# Patient Record
Sex: Female | Born: 1990 | Race: White | Hispanic: No | Marital: Married | Smoking: Current every day smoker
Health system: Southern US, Community
[De-identification: ages and names within clinical notes are randomized; demographics above are authoritative.]

## PROBLEM LIST (undated history)

## (undated) ENCOUNTER — Inpatient Hospital Stay: Payer: Self-pay

## (undated) DIAGNOSIS — B1920 Unspecified viral hepatitis C without hepatic coma: Secondary | ICD-10-CM

## (undated) DIAGNOSIS — J45909 Unspecified asthma, uncomplicated: Secondary | ICD-10-CM

## (undated) DIAGNOSIS — F3181 Bipolar II disorder: Secondary | ICD-10-CM

## (undated) DIAGNOSIS — O43119 Circumvallate placenta, unspecified trimester: Secondary | ICD-10-CM

## (undated) DIAGNOSIS — E785 Hyperlipidemia, unspecified: Secondary | ICD-10-CM

## (undated) DIAGNOSIS — K219 Gastro-esophageal reflux disease without esophagitis: Secondary | ICD-10-CM

## (undated) HISTORY — DX: Bipolar II disorder: F31.81

## (undated) HISTORY — DX: Gastro-esophageal reflux disease without esophagitis: K21.9

## (undated) HISTORY — DX: Unspecified asthma, uncomplicated: J45.909

## (undated) HISTORY — DX: Circumvallate placenta, unspecified trimester: O43.119

## (undated) HISTORY — DX: Hyperlipidemia, unspecified: E78.5

## (undated) HISTORY — PX: APPENDECTOMY: SHX54

## (undated) HISTORY — PX: WISDOM TOOTH EXTRACTION: SHX21

---
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
## 2014-06-21 ENCOUNTER — Emergency Department: Payer: Self-pay | Admitting: Emergency Medicine

## 2014-06-21 LAB — COMPREHENSIVE METABOLIC PANEL
ALBUMIN: 4.4 g/dL (ref 3.4–5.0)
ALK PHOS: 83 U/L
Anion Gap: 14 (ref 7–16)
BUN: 10 mg/dL (ref 7–18)
Bilirubin,Total: 0.6 mg/dL (ref 0.2–1.0)
CHLORIDE: 103 mmol/L (ref 98–107)
CO2: 20 mmol/L — AB (ref 21–32)
Calcium, Total: 9.7 mg/dL (ref 8.5–10.1)
Creatinine: 0.87 mg/dL (ref 0.60–1.30)
Glucose: 86 mg/dL (ref 65–99)
OSMOLALITY: 272 (ref 275–301)
Potassium: 3.8 mmol/L (ref 3.5–5.1)
SGOT(AST): 66 U/L — ABNORMAL HIGH (ref 15–37)
SGPT (ALT): 126 U/L — ABNORMAL HIGH
Sodium: 137 mmol/L (ref 136–145)
Total Protein: 8 g/dL (ref 6.4–8.2)

## 2014-06-21 LAB — CBC
HCT: 40.9 % (ref 35.0–47.0)
HGB: 14 g/dL (ref 12.0–16.0)
MCH: 31.7 pg (ref 26.0–34.0)
MCHC: 34.1 g/dL (ref 32.0–36.0)
MCV: 93 fL (ref 80–100)
Platelet: 207 10*3/uL (ref 150–440)
RBC: 4.4 10*6/uL (ref 3.80–5.20)
RDW: 12.5 % (ref 11.5–14.5)
WBC: 7.8 10*3/uL (ref 3.6–11.0)

## 2014-06-21 LAB — URINALYSIS, COMPLETE
BILIRUBIN, UR: NEGATIVE
Blood: NEGATIVE
GLUCOSE, UR: NEGATIVE mg/dL (ref 0–75)
Leukocyte Esterase: NEGATIVE
Nitrite: NEGATIVE
Ph: 5 (ref 4.5–8.0)
Protein: NEGATIVE
SPECIFIC GRAVITY: 1.017 (ref 1.003–1.030)
WBC UR: 5 /HPF (ref 0–5)

## 2014-06-21 LAB — DRUG SCREEN, URINE

## 2014-06-21 LAB — SALICYLATE LEVEL: SALICYLATES, SERUM: 2 mg/dL

## 2014-06-21 LAB — ACETAMINOPHEN LEVEL

## 2014-06-21 LAB — ETHANOL: Ethanol %: <0.003 % (ref 0.000–0.080)

## 2014-06-21 LAB — PREGNANCY, URINE: Pregnancy Test, Urine: NEGATIVE m[IU]/mL

## 2014-07-26 ENCOUNTER — Emergency Department: Payer: Self-pay | Admitting: Emergency Medicine

## 2014-08-02 ENCOUNTER — Emergency Department: Payer: Self-pay | Admitting: Emergency Medicine

## 2014-08-03 ENCOUNTER — Emergency Department: Payer: Self-pay | Admitting: Emergency Medicine

## 2014-08-15 ENCOUNTER — Emergency Department: Payer: Self-pay | Admitting: Emergency Medicine

## 2014-09-16 ENCOUNTER — Emergency Department: Payer: Self-pay | Admitting: Emergency Medicine

## 2014-09-16 LAB — CBC
HCT: 42.8 % (ref 35.0–47.0)
HGB: 14.5 g/dL (ref 12.0–16.0)
MCH: 31.6 pg (ref 26.0–34.0)
MCHC: 33.8 g/dL (ref 32.0–36.0)
MCV: 93 fL (ref 80–100)
Platelet: 173 10*3/uL (ref 150–440)
RBC: 4.58 10*6/uL (ref 3.80–5.20)
RDW: 12.8 % (ref 11.5–14.5)
WBC: 6.6 10*3/uL (ref 3.6–11.0)

## 2014-09-16 LAB — COMPREHENSIVE METABOLIC PANEL
ALBUMIN: 4.5 g/dL (ref 3.4–5.0)
ALK PHOS: 78 U/L
ALT: 199 U/L — AB
Anion Gap: 9 (ref 7–16)
BUN: 7 mg/dL (ref 7–18)
Bilirubin,Total: 0.6 mg/dL (ref 0.2–1.0)
CHLORIDE: 105 mmol/L (ref 98–107)
Calcium, Total: 8.6 mg/dL (ref 8.5–10.1)
Co2: 24 mmol/L (ref 21–32)
Creatinine: 0.74 mg/dL (ref 0.60–1.30)
EGFR (African American): >60
EGFR (Non-African Amer.): >60
GLUCOSE: 85 mg/dL (ref 65–99)
Osmolality: 273 (ref 275–301)
Potassium: 3.7 mmol/L (ref 3.5–5.1)
SGOT(AST): 118 U/L — ABNORMAL HIGH (ref 15–37)
Sodium: 138 mmol/L (ref 136–145)
TOTAL PROTEIN: 8 g/dL (ref 6.4–8.2)

## 2014-09-16 LAB — URINALYSIS, COMPLETE
Bilirubin,UR: NEGATIVE
Blood: NEGATIVE
Glucose,UR: NEGATIVE mg/dL (ref 0–75)
Leukocyte Esterase: NEGATIVE
NITRITE: NEGATIVE
Ph: 6 (ref 4.5–8.0)
Protein: NEGATIVE
RBC, UR: NONE SEEN /HPF (ref 0–5)
Specific Gravity: 1.013 (ref 1.003–1.030)
Squamous Epithelial: 3
WBC UR: 1 /HPF (ref 0–5)

## 2014-10-21 ENCOUNTER — Ambulatory Visit: Payer: Self-pay | Admitting: Family Medicine

## 2014-11-13 NOTE — L&D Delivery Note (Signed)
Delivery Note At 12:47 AM a viable female was delivered via Vaginal, Spontaneous Delivery (Presentation: Left Occiput Anterior).  APGAR: 9, 9; weight  .   Placenta status: Intact, Spontaneous.  Cord: 3 vessels with the following complications: None.  Cord pH: n/a  Anesthesia: Local Epidural yes Episiotomy: None Lacerations: 2nd degree Suture Repair: 3.0 vicryl Est. Blood Loss (mL): 300  Mom to postpartum.  Baby to Couplet care / Skin to Skin.  Mom was complete and labored down for a short time until she could not bear not to push.  Controlled second stage with smooth delivery of head.  No nuchal cord.  Baby delivered and placed on mom's chest.  Delayed cord clamping until pulseless.  Cord doubly clamped and cut by FOB.  Cord blood collected for O+ status.  Placenta spontaneously delivered and intact.  Shallow 2nd degree repaired with 3-0 vicryl.  Mom and baby bonding at time of note; weight not yet recorded.  We sang happy birthday to [place]   ----- Ranae Plumberhelsea Xcaret Morad, MD Attending Obstetrician and Gynecologist [facility]

## 2014-12-26 ENCOUNTER — Emergency Department: Payer: Self-pay | Admitting: Emergency Medicine

## 2015-02-25 ENCOUNTER — Encounter
Admit: 2015-02-25 | Disposition: A | Discharge status: Home / Self Care | Payer: Self-pay | Attending: Obstetrics & Gynecology | Admitting: Obstetrics & Gynecology

## 2015-03-06 NOTE — Consult Note (Signed)
PATIENT NAME:  Shelley Aguirre, Shelley Aguirre MR#:  [id number] DATE OF BIRTH:  09-08-1991  DATE OF CONSULTATION:  06/22/2014  REFERRING PHYSICIAN:   CONSULTING PHYSICIAN:  Audery AmelJohn T. Kenlei Safi, MD  IDENTIFYING INFORMATION AND REASON FOR CONSULT: A 24 year old woman with a history of bipolar disorder, came voluntarily to the Emergency Room because of a tic that she was experiencing.   HISTORY OF PRESENT ILLNESS: Information obtained from the patient and the chart. The patient states that she came to the Emergency Room because she was having a vocal tic that had been going on for some time, consisting of a puffing sort of noise that she made. She had been having some anxiety recently. Her mood had been not particularly depressed. Had not been having any suicidal ideation. Denied any homicidal ideation. Denied any psychotic symptoms. She does have stress related to the fact that she has only recently moved to the [place] area and does not yet have any kind of treatment provider except for her obstetrician. She is trying to get pregnant and is taking Clomid. Otherwise, however, she feels like her psychiatric symptoms are under reasonably good control. She has not been drinking or abusing drugs recently.   PAST PSYCHIATRIC HISTORY: The patient describes a long-standing history of mood problems. She has had past suicide attempts, past cutting, past hospitalizations. Has been on multiple medications. Prior to moving here, she had been living in [place], where evidently she was seeing a psychiatrist and a therapist and felt that she had achieved stability taking a combination of Abilify, lamotrigine and clonazepam. She has now been off of all those medicines since coming to [place]. She has a diagnosis apparently of bipolar disorder.   SUBSTANCE ABUSE HISTORY: The patient states that she drinks only occasionally. Used to have a heroin problem. Has not been using it in quite a while. Denies any other current drug abuse.    SOCIAL HISTORY: She is married. She and her husband live together they just recently moved to this area for her husband's job. Previously, she had been living in [place]. Has no children currently. The patient is not yet working outside the home.   FAMILY HISTORY: Positive.   CURRENT MEDICATIONS: She is taking an aspirin once a day and Clomid on a regular basis from an obstetrician trying to get pregnant.   ALLERGIES: PENICILLIN.   REVIEW OF SYSTEMS: Denies acute depression. Denies suicidal or homicidal ideation. Denies hallucinations. No specific physical symptoms currently, other than being a little bit jittery. She was having a vocal tic again earlier, but that has cleared up.   MENTAL STATUS EXAMINATION: Neatly dressed and groomed woman, looks her stated age, cooperative with the interview. Good eye contact, normal psychomotor activity. Speech normal rate, tone and volume. Affect euthymic, reactive, appropriate. Mood stated as okay. Thoughts are lucid without loosening associations or delusions. Denies auditory or visual hallucinations. Denies suicidal or homicidal ideation. Judgment and insight appear to be good. Short and long-term memory intact to gross testing. Alert and oriented x 4.   LABORATORY RESULTS: Salicylates and acetaminophen negative. Alcohol level negative. Drug screen negative. She does have an elevated ALT at 126 and an elevated AST at 66, and are really the only remarkable laboratories. CBC is normal. Pregnancy test negative.   VITAL SIGNS: Most recent blood pressure is 109/66, respirations 18, pulse 70, temperature 97.9.   ASSESSMENT: A 24 year old woman, who probably has bipolar disorder, type II, versus borderline personality disorder, who is currently relatively asymptomatic. She is not psychotic  and denies any suicidal or homicidal ideation. Has not shown any behavioral problems since being here in the Emergency Room. I am not entirely clear on why she was committed.  The commitment paperwork says that she had mentioned something about slitting her throat, but she denies it, and I do not see it in the other paperwork. At this point, she no longer meets commitment criteria.   TREATMENT PLAN: She will be given a referral to outpatient treatment in the community and encouraged to follow up with them. She was educated about her liver enzymes and encouraged to mention it to a primary care doctor at her earliest opportunity.   DIAGNOSIS, PRINCIPAL AND PRIMARY:  AXIS I: Bipolar disorder, not otherwise specified.   SECONDARY DIAGNOSES:  AXIS I: Opiate dependence, in remission.  AXIS II: Deferred.  AXIS III: No diagnosis.  AXIS IV: Severe from recent relocation.  AXIS V: Functioning at time of evaluation, 60.   ____________________________ Audery Amel, MD jtc:jr D: 06/22/2014 16:30:12 ET T: 06/22/2014 16:39:12 ET JOB#: [id number]  cc: Audery Amel, MD, <Dictator> Audery Amel MD ELECTRONICALLY SIGNED 07/24/2014 16:54
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
## 2015-04-16 ENCOUNTER — Encounter: Payer: Self-pay | Admitting: Family Medicine

## 2015-04-16 ENCOUNTER — Ambulatory Visit (INDEPENDENT_AMBULATORY_CARE_PROVIDER_SITE_OTHER): Payer: Commercial Managed Care - HMO | Admitting: Family Medicine

## 2015-04-16 VITALS — BP 116/73 | HR 109 | Temp 97.8°F | Ht 65.3 in | Wt 207.4 lb

## 2015-04-16 DIAGNOSIS — M9905 Segmental and somatic dysfunction of pelvic region: Secondary | ICD-10-CM | POA: Diagnosis not present

## 2015-04-16 DIAGNOSIS — M9903 Segmental and somatic dysfunction of lumbar region: Secondary | ICD-10-CM | POA: Diagnosis not present

## 2015-04-16 DIAGNOSIS — M9909 Segmental and somatic dysfunction of abdomen and other regions: Secondary | ICD-10-CM

## 2015-04-16 DIAGNOSIS — M5441 Lumbago with sciatica, right side: Secondary | ICD-10-CM

## 2015-04-16 DIAGNOSIS — M9904 Segmental and somatic dysfunction of sacral region: Secondary | ICD-10-CM | POA: Diagnosis not present

## 2015-04-16 DIAGNOSIS — F3181 Bipolar II disorder: Secondary | ICD-10-CM | POA: Diagnosis not present

## 2015-04-16 DIAGNOSIS — M9906 Segmental and somatic dysfunction of lower extremity: Secondary | ICD-10-CM

## 2015-04-16 DIAGNOSIS — M545 Low back pain, unspecified: Secondary | ICD-10-CM | POA: Insufficient documentation

## 2015-04-16 DIAGNOSIS — Z3490 Encounter for supervision of normal pregnancy, unspecified, unspecified trimester: Secondary | ICD-10-CM

## 2015-04-16 NOTE — Assessment & Plan Note (Signed)
Likely will be exacerbating her current symptoms due to the relaxin. Will see her during her pregnancy following her OB schedule.

## 2015-04-16 NOTE — Patient Instructions (Signed)
Back Injury Prevention The following tips can help you to prevent a back injury. PHYSICAL FITNESS  Exercise often. Try to develop strong stomach (abdominal) muscles.  Do aerobic exercises often. This includes walking, jogging, biking, swimming.  Do exercises that help with balance and strength often. This includes tai chi and yoga.  Stretch before and after you exercise.  Keep a healthy weight. DIET   Ask your doctor how much calcium and vitamin D you need every day.  Include calcium in your diet. Foods high in calcium include dairy products; green, leafy vegetables; and products with calcium added (fortified).  Include vitamin D in your diet. Foods high in vitamin D include milk and products with vitamin D added.  Think about taking a multivitamin or other nutritional products called " supplements."  Stop smoking if you smoke. POSTURE   Sit and stand up straight. Avoid leaning forward or hunching over.  Choose chairs that support your lower back.  If you work at a desk:  Sit close to your work so you do not lean over.  Keep your chin tucked in.  Keep your neck drawn back.  Keep your elbows bent at a right angle. Your arms should look like the letter "L."  Sit high and close to the steering wheel when you drive. Add low back support to your car seat if needed.  Avoid sitting or standing in one position for too long. Get up and move around every hour. Take breaks if you are driving for a long time.  Sleep on your side with your knees slightly bent. You can also sleep on your back with a pillow under your knees. Do not sleep on your stomach. LIFTING, TWISTING, AND REACHING  Avoid heavy lifting, especially lifting over and over again. If you must do heavy lifting:  Stretch before lifting.  Work slowly.  Rest between lifts.  Use carts and dollies to move objects when possible.  Make several small trips instead of carrying 1 heavy load.  Ask for help when you  need it.  Ask for help when moving big, awkward objects.  Follow these steps when lifting:  Stand with your feet shoulder-width apart.  Get as close to the object as you can. Do not pick up heavy objects that are far from your body.  Use handles or lifting straps when possible.  Bend at your knees. Squat down, but keep your heels off the floor.  Keep your shoulders back, your chin tucked in, and your back straight.  Lift the object slowly. Tighten the muscles in your legs, stomach, and butt. Keep the object as close to the center of your body as possible.  Reverse these directions when you put a load down.  Do not:  Lift the object above your waist.  Twist at the waist while lifting or carrying a load. Move your feet if you need to turn, not your waist.  Bend over without bending at your knees.  Avoid reaching over your head, across a table, or for an object on a high surface. OTHER TIPS  Avoid wet floors and keep sidewalks clear of ice.  Do not sleep on a mattress that is too soft or too hard.  Keep items that you use often within easy reach.  Put heavier objects on shelves at waist level. Put lighter objects on lower or higher shelves.  Find ways to lessen your stress. You can try exercise, massage, or relaxation.  Get help for depression or anxiety if  needed. GET HELP IF:  You injure your back.  You have questions about diet, exercise, or other ways to prevent back injuries. MAKE SURE YOU:  Understand these instructions.  Will watch your condition.  Will get help right away if you are not doing well or get worse. Document Released: 04/17/2008 Document Revised: 01/22/2012 Document Reviewed: 12/11/2011 [facility] Patient Information 2015 [place], [place]. This information is not intended to replace advice given to you by your health care provider. Make sure you discuss any questions you have with your health care provider.

## 2015-04-16 NOTE — Assessment & Plan Note (Signed)
Advised patient that she should try to see psychiatry. Name of Dr. Maryruth [place]Kapur in [place] given to patient. Continue to monitor closely both here and with her OB.

## 2015-04-16 NOTE — Assessment & Plan Note (Signed)
This appears to be better today as she has been doing her stretches and has not been working anymore. 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 I do think she would benefit from some PT as well prior to her not being able to do due to her pregnancy. Referral generated 2 visits ago, but she hasn't gone yet. She does appear to have some somatic dysfunction that is contributing to her symptoms and I think she would benefit from OMT. Patient treated today as discussed below.
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
## 2015-04-22 ENCOUNTER — Telehealth: Payer: Self-pay

## 2015-04-22 NOTE — Telephone Encounter (Signed)
Patient would like to know if you have found a Psychiatrist for her that will take her insurance or work with her on payment.

## 2015-04-22 NOTE — Telephone Encounter (Signed)
Called patient, let her know to try and call Dr.Kapur [phone] and [facility] [phone]. I also mailed out a copy of our list of psychiatrist.

## 2015-04-30 ENCOUNTER — Encounter: Payer: Self-pay | Admitting: Family Medicine

## 2015-04-30 ENCOUNTER — Ambulatory Visit (INDEPENDENT_AMBULATORY_CARE_PROVIDER_SITE_OTHER): Payer: Commercial Managed Care - HMO | Admitting: Family Medicine

## 2015-04-30 VITALS — BP 126/86 | HR 82 | Temp 97.8°F | Resp 16 | Ht 65.5 in | Wt 212.8 lb

## 2015-04-30 DIAGNOSIS — M9903 Segmental and somatic dysfunction of lumbar region: Secondary | ICD-10-CM | POA: Diagnosis not present

## 2015-04-30 DIAGNOSIS — M9905 Segmental and somatic dysfunction of pelvic region: Secondary | ICD-10-CM

## 2015-04-30 DIAGNOSIS — M5441 Lumbago with sciatica, right side: Secondary | ICD-10-CM

## 2015-04-30 DIAGNOSIS — Z3490 Encounter for supervision of normal pregnancy, unspecified, unspecified trimester: Secondary | ICD-10-CM

## 2015-04-30 DIAGNOSIS — M999 Biomechanical lesion, unspecified: Secondary | ICD-10-CM

## 2015-04-30 DIAGNOSIS — M9904 Segmental and somatic dysfunction of sacral region: Secondary | ICD-10-CM | POA: Diagnosis not present

## 2015-04-30 DIAGNOSIS — M9909 Segmental and somatic dysfunction of abdomen and other regions: Secondary | ICD-10-CM | POA: Diagnosis not present

## 2015-04-30 DIAGNOSIS — M9902 Segmental and somatic dysfunction of thoracic region: Secondary | ICD-10-CM | POA: Diagnosis not present

## 2015-04-30 DIAGNOSIS — Z331 Pregnant state, incidental: Secondary | ICD-10-CM | POA: Diagnosis not present

## 2015-04-30 NOTE — Assessment & Plan Note (Signed)
Likely will be exacerbating her current symptoms due to the relaxin. Her abdominal pain sounds like round ligament pain, but she will discuss it with her OB.  Will see her during her pregnancy following her OB schedule.

## 2015-04-30 NOTE — Patient Instructions (Signed)
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
## 2015-05-14 ENCOUNTER — Ambulatory Visit (INDEPENDENT_AMBULATORY_CARE_PROVIDER_SITE_OTHER): Payer: Commercial Managed Care - HMO | Admitting: Family Medicine

## 2015-05-14 ENCOUNTER — Encounter: Payer: Self-pay | Admitting: Family Medicine

## 2015-05-14 VITALS — BP 123/83 | HR 82 | Temp 97.8°F | Wt 214.8 lb

## 2015-05-14 DIAGNOSIS — Z331 Pregnant state, incidental: Secondary | ICD-10-CM

## 2015-05-14 DIAGNOSIS — M5441 Lumbago with sciatica, right side: Secondary | ICD-10-CM | POA: Diagnosis not present

## 2015-05-14 DIAGNOSIS — Z3490 Encounter for supervision of normal pregnancy, unspecified, unspecified trimester: Secondary | ICD-10-CM

## 2015-05-14 DIAGNOSIS — M9902 Segmental and somatic dysfunction of thoracic region: Secondary | ICD-10-CM | POA: Diagnosis not present

## 2015-05-14 DIAGNOSIS — M9903 Segmental and somatic dysfunction of lumbar region: Secondary | ICD-10-CM | POA: Diagnosis not present

## 2015-05-14 DIAGNOSIS — M9909 Segmental and somatic dysfunction of abdomen and other regions: Secondary | ICD-10-CM | POA: Diagnosis not present

## 2015-05-14 DIAGNOSIS — M9906 Segmental and somatic dysfunction of lower extremity: Secondary | ICD-10-CM | POA: Diagnosis not present

## 2015-05-14 DIAGNOSIS — M9904 Segmental and somatic dysfunction of sacral region: Secondary | ICD-10-CM | POA: Diagnosis not present

## 2015-05-14 DIAGNOSIS — M9905 Segmental and somatic dysfunction of pelvic region: Secondary | ICD-10-CM | POA: Diagnosis not present

## 2015-05-14 DIAGNOSIS — M999 Biomechanical lesion, unspecified: Secondary | ICD-10-CM

## 2015-05-14 NOTE — Assessment & Plan Note (Signed)
Stable. Contributing to her symptoms. Continue to follow with OBGYN
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
## 2015-05-14 NOTE — Patient Instructions (Signed)

## 2015-05-28 ENCOUNTER — Ambulatory Visit (INDEPENDENT_AMBULATORY_CARE_PROVIDER_SITE_OTHER): Payer: Commercial Managed Care - HMO | Admitting: Family Medicine

## 2015-05-28 ENCOUNTER — Encounter: Payer: Self-pay | Admitting: Family Medicine

## 2015-05-28 VITALS — BP 119/77 | HR 86 | Temp 98.0°F | Wt 217.4 lb

## 2015-05-28 DIAGNOSIS — M9903 Segmental and somatic dysfunction of lumbar region: Secondary | ICD-10-CM

## 2015-05-28 DIAGNOSIS — Z3490 Encounter for supervision of normal pregnancy, unspecified, unspecified trimester: Secondary | ICD-10-CM

## 2015-05-28 DIAGNOSIS — M9904 Segmental and somatic dysfunction of sacral region: Secondary | ICD-10-CM

## 2015-05-28 DIAGNOSIS — M999 Biomechanical lesion, unspecified: Secondary | ICD-10-CM

## 2015-05-28 DIAGNOSIS — M9905 Segmental and somatic dysfunction of pelvic region: Secondary | ICD-10-CM | POA: Diagnosis not present

## 2015-05-28 DIAGNOSIS — M5441 Lumbago with sciatica, right side: Secondary | ICD-10-CM

## 2015-05-28 DIAGNOSIS — M9909 Segmental and somatic dysfunction of abdomen and other regions: Secondary | ICD-10-CM | POA: Diagnosis not present

## 2015-05-28 DIAGNOSIS — Z331 Pregnant state, incidental: Secondary | ICD-10-CM | POA: Diagnosis not present

## 2015-05-28 DIAGNOSIS — M9908 Segmental and somatic dysfunction of rib cage: Secondary | ICD-10-CM

## 2015-05-28 NOTE — Assessment & Plan Note (Signed)
Progressing well. 27 weeks. Continue to follow with GYN. Continue to monitor.
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
## 2015-06-11 ENCOUNTER — Encounter: Payer: Self-pay | Admitting: Family Medicine

## 2015-06-11 ENCOUNTER — Ambulatory Visit (INDEPENDENT_AMBULATORY_CARE_PROVIDER_SITE_OTHER): Payer: Commercial Managed Care - HMO | Admitting: Family Medicine

## 2015-06-11 VITALS — BP 139/81 | HR 86 | Temp 98.3°F | Wt 218.0 lb

## 2015-06-11 DIAGNOSIS — M5441 Lumbago with sciatica, right side: Secondary | ICD-10-CM | POA: Diagnosis not present

## 2015-06-11 DIAGNOSIS — M9905 Segmental and somatic dysfunction of pelvic region: Secondary | ICD-10-CM

## 2015-06-11 DIAGNOSIS — Z331 Pregnant state, incidental: Secondary | ICD-10-CM

## 2015-06-11 DIAGNOSIS — M9903 Segmental and somatic dysfunction of lumbar region: Secondary | ICD-10-CM

## 2015-06-11 DIAGNOSIS — M9904 Segmental and somatic dysfunction of sacral region: Secondary | ICD-10-CM | POA: Diagnosis not present

## 2015-06-11 DIAGNOSIS — M9902 Segmental and somatic dysfunction of thoracic region: Secondary | ICD-10-CM

## 2015-06-11 DIAGNOSIS — Z3490 Encounter for supervision of normal pregnancy, unspecified, unspecified trimester: Secondary | ICD-10-CM

## 2015-06-11 DIAGNOSIS — M999 Biomechanical lesion, unspecified: Secondary | ICD-10-CM
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
## 2015-06-12 NOTE — Assessment & Plan Note (Signed)
This seems to be in mild exacerbation today due to fetal lie, but still relatively stable. 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 On limited activity due to placental location, She does appear to have some somatic dysfunction that is contributing to her symptoms and I think she would benefit from OMT. Patient treated today as discussed below.

## 2015-06-12 NOTE — Assessment & Plan Note (Signed)
Progressing well. 29 weeks. Continue to follow with GYN. Continue to monitor.

## 2015-06-25 ENCOUNTER — Encounter: Payer: Self-pay | Admitting: Family Medicine

## 2015-06-25 ENCOUNTER — Ambulatory Visit (INDEPENDENT_AMBULATORY_CARE_PROVIDER_SITE_OTHER): Payer: Commercial Managed Care - HMO | Admitting: Family Medicine

## 2015-06-25 VITALS — BP 111/77 | HR 86 | Temp 98.6°F | Wt 221.4 lb

## 2015-06-25 DIAGNOSIS — M9905 Segmental and somatic dysfunction of pelvic region: Secondary | ICD-10-CM | POA: Diagnosis not present

## 2015-06-25 DIAGNOSIS — M9904 Segmental and somatic dysfunction of sacral region: Secondary | ICD-10-CM | POA: Diagnosis not present

## 2015-06-25 DIAGNOSIS — M9900 Segmental and somatic dysfunction of head region: Secondary | ICD-10-CM | POA: Diagnosis not present

## 2015-06-25 DIAGNOSIS — M9903 Segmental and somatic dysfunction of lumbar region: Secondary | ICD-10-CM

## 2015-06-25 DIAGNOSIS — M9902 Segmental and somatic dysfunction of thoracic region: Secondary | ICD-10-CM

## 2015-06-25 DIAGNOSIS — Z3490 Encounter for supervision of normal pregnancy, unspecified, unspecified trimester: Secondary | ICD-10-CM

## 2015-06-25 DIAGNOSIS — Z331 Pregnant state, incidental: Secondary | ICD-10-CM

## 2015-06-25 DIAGNOSIS — M9909 Segmental and somatic dysfunction of abdomen and other regions: Secondary | ICD-10-CM

## 2015-06-25 DIAGNOSIS — M5441 Lumbago with sciatica, right side: Secondary | ICD-10-CM

## 2015-06-25 NOTE — Progress Notes (Signed)
BP 111/77 mmHg  Pulse 86  Temp(Src) 98.6 F (37 C)  Wt 221 lb 6.4 oz (100.426 kg)  SpO2 98%  LMP  (Within Months)   Subjective:    Patient ID: Shelley Aguirre, female    DOB: [birth year], 24 y.o.   MRN: [id number]  HPI: Shelley Aguirre is a 23 y.o. female  Chief Complaint  Patient presents with  . Back Pain    OMM   Shelley Aguirre is feeling well today. She is at 31 weeks and progressing well. Baby still breach. Continuing with pain in her R hip and low back, but dealing with it better. Lots of pressure in her belly from baby kicking and pushing. No numbness or tingling. No shooting pains. Bending over and working makes it worse. Otherwise has been doing well. OMT helps. She would like to continue.   Relevant past medical, surgical, family and social history reviewed and updated as indicated. Interim medical history since our last visit reviewed. Allergies and medications reviewed and updated.  Review of Systems  Constitutional: Negative.   Respiratory: Negative.   Cardiovascular: Negative.   Gastrointestinal: Negative.   Musculoskeletal: Negative.   Psychiatric/Behavioral: Negative.    Per HPI unless specifically indicated above    Objective:    BP 111/77 mmHg  Pulse 86  Temp(Src) 98.6 F (37 C)  Wt 221 lb 6.4 oz (100.426 kg)  SpO2 98%  LMP  (Within Months)  Wt Readings from Last 3 Encounters:  06/25/15 221 lb 6.4 oz (100.426 kg)  06/11/15 218 lb (98.884 kg)  05/28/15 217 lb 6.4 oz (98.612 kg)    Physical Exam  Constitutional: She is oriented to person, place, and time. She appears well-developed and well-nourished. No distress.  HENT:  Head: Normocephalic and atraumatic.  Right Ear: Hearing normal.  Left Ear: Hearing normal.  Nose: Nose normal.  Eyes: Conjunctivae and lids are normal. Right eye exhibits no discharge. Left eye exhibits no discharge. No scleral icterus.  Pulmonary/Chest: Effort normal. No respiratory distress.  Abdominal: Soft. She exhibits no distension and no  mass. There is no tenderness. There is no rebound and no guarding.  Gravid  Neurological: She is alert and oriented to person, place, and time.  Skin: Skin is intact. No rash noted.  Psychiatric: She has a normal mood and affect. Her speech is normal and behavior is normal. Judgment and thought content normal. Cognition and memory are normal.  Nursing note and vitals reviewed. Musculoskeletal:  Exam found Decreased ROM, Tissue texture changes and Tenderness to palpation of patient's  head, thorax, lumbar, pelvis, sacrum and abdomen Osteopathic Structural Exam:   Head: hypertonic subocciptial muscles  Thorax: T3-5SLRR, T6-9SRRL  Lumbar: QL hypertonic on the R  Pelvis: Anterior R innominate  Sacrum: R on R torsion  Abdomen: pelvic diaphragm strain from uterus and fetus  Results for orders placed or performed in visit on 09/16/14  Urinalysis, Complete  Result Value Ref Range   Color - urine Yellow    Clarity - urine Hazy    Glucose,UR Negative 0-75 mg/dL   Bilirubin,UR Negative NEGATIVE   Ketone 1+ NEGATIVE   Specific Gravity 1.013 1.003-1.030   Blood Negative NEGATIVE   Ph 6.0 4.5-8.0   Protein Negative NEGATIVE   Nitrite Negative NEGATIVE   Leukocyte Esterase Negative NEGATIVE   RBC,UR NONE SEEN 0-5 /HPF   WBC UR 1 /HPF 0-5 /HPF   Bacteria TRACE NONE SEEN   Squamous Epithelial 3 /HPF    Mucous PRESENT   Comprehensive metabolic  panel  Result Value Ref Range   Glucose 85 65-99 mg/dL   BUN 7 7-18 mg/dL   Creatinine 0.74 0.60-1.30 mg/dL   Sodium 138 136-145 mmol/L   Potassium 3.7 3.5-5.1 mmol/L   Chloride 105 98-107 mmol/L   Co2 24 21-32 mmol/L   Calcium, Total 8.6 8.5-10.1 mg/dL   SGOT(AST) 118 (H) 15-37 Unit/L   SGPT (ALT) 199 (H) U/L   Alkaline Phosphatase 78 Unit/L   Albumin 4.5 3.4-5.0 g/dL   Total Protein 8.0 6.4-8.2 g/dL   Bilirubin,Total 0.6 0.2-1.0 mg/dL   Osmolality 273 275-301   Anion Gap 9 7-16   EGFR (African American) >60 >65m/min   EGFR (Non-African  Amer.) >60 >645mmin  CBC  Result Value Ref Range   WBC 6.6 3.6-11.0 x10 3/mm 3   RBC 4.58 3.80-5.20 X10 6/mm 3   HGB 14.5 12.0-16.0 g/dL   HCT 42.8 35.0-47.0 %   MCV 93 80-100 fL   MCH 31.6 26.0-34.0 pg   MCHC 33.8 32.0-36.0 g/dL   RDW 12.8 11.5-14.5 %   Platelet 173 150-440 x10 3/mm 3      Assessment & Plan:   Problem List Items Addressed This Visit      Other   Low back pain - Primary    This seems to be stable today. 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 On limited activity due to placental location, She does appear to have some somatic dysfunction that is contributing to her symptoms and I think she would benefit from OMT. Patient treated today as discussed below.               Pregnancy    Progressing well. 31 weeks. Continue to follow with GYN. Continue to monitor.            Other Visit Diagnoses    Somatic dysfunction of spine, sacral        Somatic dysfunction of abdominal region        Segmental dysfunction of pelvic region        Somatic dysfunction of spine, lumbar        Thoracic segment dysfunction        Somatic dysfunction of head region         After verbal consent was obtained, patient was treated today with osteopathic manipulative medicine to the regions of the head, thorax, lumbar, pelvis, sacrum and abdomen using the techniques of Still, myofascial release, counterstrain, muscle energy, HVLA and soft tissue. Areas of compensation relating to her primary pain source also treated. Patient tolerated the procedure well with good objective and good subjective improvement in symptoms. She left the room in good condition. She was advised to stay well hydrated and that she may have some soreness following the procedure. If not improving or worsening, she will call and come in. She will return for reevaluation   in 2-3  weeks.   Follow up plan: Return in about 2 weeks (around 07/09/2015).

## 2015-06-25 NOTE — Assessment & Plan Note (Signed)
Progressing well. 31 weeks. Continue to follow with GYN. Continue to monitor.

## 2015-06-25 NOTE — Assessment & Plan Note (Signed)
This seems to be stable today. 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 On limited activity due to placental location, She does appear to have some somatic dysfunction that is contributing to her symptoms and I think she would benefit from OMT. Patient treated today as discussed below.

## 2015-07-09 ENCOUNTER — Encounter: Payer: Self-pay | Admitting: Family Medicine

## 2015-07-09 ENCOUNTER — Ambulatory Visit (INDEPENDENT_AMBULATORY_CARE_PROVIDER_SITE_OTHER): Payer: Commercial Managed Care - HMO | Admitting: Family Medicine

## 2015-07-09 VITALS — BP 123/73 | HR 105 | Temp 98.2°F | Wt 220.6 lb

## 2015-07-09 DIAGNOSIS — M9903 Segmental and somatic dysfunction of lumbar region: Secondary | ICD-10-CM | POA: Diagnosis not present

## 2015-07-09 DIAGNOSIS — M9902 Segmental and somatic dysfunction of thoracic region: Secondary | ICD-10-CM

## 2015-07-09 DIAGNOSIS — Z3490 Encounter for supervision of normal pregnancy, unspecified, unspecified trimester: Secondary | ICD-10-CM

## 2015-07-09 DIAGNOSIS — Z331 Pregnant state, incidental: Secondary | ICD-10-CM

## 2015-07-09 DIAGNOSIS — M9908 Segmental and somatic dysfunction of rib cage: Secondary | ICD-10-CM | POA: Diagnosis not present

## 2015-07-09 DIAGNOSIS — M9905 Segmental and somatic dysfunction of pelvic region: Secondary | ICD-10-CM | POA: Diagnosis not present

## 2015-07-09 DIAGNOSIS — M5441 Lumbago with sciatica, right side: Secondary | ICD-10-CM | POA: Diagnosis not present

## 2015-07-09 DIAGNOSIS — M999 Biomechanical lesion, unspecified: Secondary | ICD-10-CM

## 2015-07-09 DIAGNOSIS — M9904 Segmental and somatic dysfunction of sacral region: Secondary | ICD-10-CM | POA: Diagnosis not present

## 2015-07-09 DIAGNOSIS — M9900 Segmental and somatic dysfunction of head region: Secondary | ICD-10-CM

## 2015-07-09 NOTE — Assessment & Plan Note (Signed)
Progressing well. 33 weeks. Continue to follow with GYN. Continue to monitor.

## 2015-07-09 NOTE — Assessment & Plan Note (Addendum)
This seems to be stable today, baby appears to have moved if not flipped, putting less pressure on the R side. 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 On limited activity due to placental location, She does appear to have some somatic dysfunction that is contributing to her symptoms and I think she would benefit from OMT. Patient treated today as discussed below.

## 2015-07-09 NOTE — Progress Notes (Signed)
BP 123/73 mmHg  Pulse 105  Temp(Src) 98.2 F (36.8 C)  Wt 220 lb 9.6 oz (100.064 kg)  SpO2 99%  LMP  (Within Months)   Subjective:    Patient ID: Shelley Aguirre, female    DOB: August 29, 1991, 24 y.o.   MRN: [id number]  HPI: Shelley Aguirre is a 24 y.o. female who presents today for evaluation of low back pain and possible treatment with OMT.   Chief Complaint  Patient presents with  . Back Pain   Shelley Aguirre is doing OK today. She is currently [redacted] weeks pregnant and progressing well. She is following with her OBGYN and they are happy with how she is doing. She thinks that he flipped from her last visit and is no longer breach. She finds out on Monday at her appointment. She notes that she is having sharp pains in her pelvis the past couple of days, they feel electric.  She notes that her back is acting up a bit more because of the pressure as the baby gets bigger. She is trying to stay off her feet and rest as when she moves around more, her pain increases. She continues to have the pin in her low back and R hip that will radiate into her R thigh. She is tolerating it well. She denies any numbness or tingling, denies andy shooting pains. She still finds that bending over and working makes it worse. OMT has been helping and she finds that she feels better for several days following her treatments. She would like to continue with them. No other concerns or complaints at this time.   Relevant past medical, surgical, family and social history reviewed and updated as indicated. Interim medical history since our last visit reviewed. Allergies and medications reviewed and updated.  Review of Systems  Constitutional: Negative.   Respiratory: Negative.   Cardiovascular: Negative.   Musculoskeletal: Positive for myalgias, back pain and gait problem. Negative for joint swelling, arthralgias, neck pain and neck stiffness.  Psychiatric/Behavioral: Negative.     Per HPI unless specifically indicated above      Objective:    BP 123/73 mmHg  Pulse 105  Temp(Src) 98.2 F (36.8 C)  Wt 220 lb 9.6 oz (100.064 kg)  SpO2 99%  LMP  (Within Months)  Wt Readings from Last 3 Encounters:  07/09/15 220 lb 9.6 oz (100.064 kg)  06/25/15 221 lb 6.4 oz (100.426 kg)  06/11/15 218 lb (98.884 kg)    Physical Exam  Constitutional: She is oriented to person, place, and time. She appears well-developed and well-nourished. No distress.  HENT:  Head: Normocephalic and atraumatic.  Right Ear: Hearing normal.  Left Ear: Hearing normal.  Nose: Nose normal.  Eyes: Conjunctivae and lids are normal. Right eye exhibits no discharge. Left eye exhibits no discharge. No scleral icterus.  Pulmonary/Chest: Effort normal. No respiratory distress.  Abdominal: Soft. She exhibits no distension and no mass. There is no tenderness. There is no rebound and no guarding.  Gravid  Musculoskeletal: Normal range of motion.  Neurological: She is alert and oriented to person, place, and time.  Skin: Skin is warm, dry and intact. No rash noted. No erythema. No pallor.  Psychiatric: She has a normal mood and affect. Her speech is normal and behavior is normal. Judgment and thought content normal. Cognition and memory are normal.  Musculoskeletal:  Exam found Decreased ROM, Tissue texture changes and Tenderness to palpation of patient's  head, thorax, ribs, lumbar, pelvis and sacrum Osteopathic Structural Exam:  Head: OAESSR, hypertonic suboccipital muscles, L>R  Thorax: T2-5SRRL, T6ESRR  Ribs: Ribs 8-10 locked up on the R  Lumbar: QL and psoas hypertonic on the R  Pelvis: pelvis compressed, R SI joint restricted, Anterior R innominate  Sacrum: L on L torsion, SI joint restricted on the R    Results for orders placed or performed in visit on 09/16/14  Urinalysis, Complete  Result Value Ref Range   Color - urine Yellow    Clarity - urine Hazy    Glucose,UR Negative 0-75 mg/dL   Bilirubin,UR Negative NEGATIVE   Ketone 1+  NEGATIVE   Specific Gravity 1.013 1.003-1.030   Blood Negative NEGATIVE   Ph 6.0 4.5-8.0   Protein Negative NEGATIVE   Nitrite Negative NEGATIVE   Leukocyte Esterase Negative NEGATIVE   RBC,UR NONE SEEN 0-5 /HPF   WBC UR 1 /HPF 0-5 /HPF   Bacteria TRACE NONE SEEN   Squamous Epithelial 3 /HPF    Mucous PRESENT   Comprehensive metabolic panel  Result Value Ref Range   Glucose 85 65-99 mg/dL   BUN 7 7-18 mg/dL   Creatinine 0.74 0.60-1.30 mg/dL   Sodium 138 136-145 mmol/L   Potassium 3.7 3.5-5.1 mmol/L   Chloride 105 98-107 mmol/L   Co2 24 21-32 mmol/L   Calcium, Total 8.6 8.5-10.1 mg/dL   SGOT(AST) 118 (H) 15-37 Unit/L   SGPT (ALT) 199 (H) U/L   Alkaline Phosphatase 78 Unit/L   Albumin 4.5 3.4-5.0 g/dL   Total Protein 8.0 6.4-8.2 g/dL   Bilirubin,Total 0.6 0.2-1.0 mg/dL   Osmolality 273 275-301   Anion Gap 9 7-16   EGFR (African American) >60 >4m/min   EGFR (Non-African Amer.) >60 >631mmin  CBC  Result Value Ref Range   WBC 6.6 3.6-11.0 x10 3/mm 3   RBC 4.58 3.80-5.20 X10 6/mm 3   HGB 14.5 12.0-16.0 g/dL   HCT 42.8 35.0-47.0 %   MCV 93 80-100 fL   MCH 31.6 26.0-34.0 pg   MCHC 33.8 32.0-36.0 g/dL   RDW 12.8 11.5-14.5 %   Platelet 173 150-440 x10 3/mm 3      Assessment & Plan:   Problem List Items Addressed This Visit      Other   Low back pain - Primary    This seems to be stable today, baby appears to have moved if not flipped, putting less pressure on the R side. 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 On limited activity due to placental location, She does appear to have some somatic dysfunction that is contributing to her symptoms and I think she would benefit from OMT. Patient treated today as discussed below.                 Pregnancy    Progressing well. 33 weeks. Continue to follow with GYN. Continue to monitor.               Other Visit Diagnoses    Somatic dysfunction of spine, sacral        Segmental dysfunction of pelvic region        Somatic dysfunction of spine, lumbar        Thoracic segment dysfunction        Somatic dysfunction of head region        Nonallopathic lesion of rib cage          After verbal  consent was obtained, patient was treated today with osteopathic manipulative medicine to the regions of the head, thorax, ribs, lumbar, pelvis and sacrum using the techniques of Still, FPR, myofascial release, counterstrain, muscle energy, HVLA and soft tissue. Areas of compensation relating to her primary pain source also treated. Patient tolerated the procedure well with good objective and good subjective improvement in symptoms. She left the room in good condition. She was advised to stay well hydrated and that she may have some soreness following the procedure. If not improving or worsening, she will call and come in. She will return for reevaluation   in 2-3 weeks.   Follow up plan: Return in about 2 weeks (around 07/23/2015).

## 2015-07-24 ENCOUNTER — Observation Stay: Payer: Commercial Managed Care - HMO

## 2015-07-24 ENCOUNTER — Observation Stay
Admission: EM | Admit: 2015-07-24 | Discharge: 2015-07-24 | Disposition: A | Discharge status: Home / Self Care | Payer: Commercial Managed Care - HMO | Attending: Obstetrics and Gynecology | Admitting: Obstetrics and Gynecology

## 2015-07-24 ENCOUNTER — Encounter: Payer: Self-pay | Admitting: *Deleted

## 2015-07-24 DIAGNOSIS — Z3A35 35 weeks gestation of pregnancy: Secondary | ICD-10-CM | POA: Diagnosis not present

## 2015-07-24 DIAGNOSIS — J45909 Unspecified asthma, uncomplicated: Secondary | ICD-10-CM | POA: Insufficient documentation

## 2015-07-24 DIAGNOSIS — E785 Hyperlipidemia, unspecified: Secondary | ICD-10-CM | POA: Insufficient documentation

## 2015-07-24 DIAGNOSIS — O99343 Other mental disorders complicating pregnancy, third trimester: Secondary | ICD-10-CM | POA: Diagnosis not present

## 2015-07-24 DIAGNOSIS — K219 Gastro-esophageal reflux disease without esophagitis: Secondary | ICD-10-CM | POA: Insufficient documentation

## 2015-07-24 DIAGNOSIS — O26893 Other specified pregnancy related conditions, third trimester: Principal | ICD-10-CM | POA: Insufficient documentation

## 2015-07-24 DIAGNOSIS — Z87891 Personal history of nicotine dependence: Secondary | ICD-10-CM | POA: Diagnosis not present

## 2015-07-24 DIAGNOSIS — F3181 Bipolar II disorder: Secondary | ICD-10-CM | POA: Insufficient documentation

## 2015-07-24 DIAGNOSIS — R112 Nausea with vomiting, unspecified: Secondary | ICD-10-CM | POA: Insufficient documentation

## 2015-07-24 DIAGNOSIS — M549 Dorsalgia, unspecified: Secondary | ICD-10-CM | POA: Insufficient documentation

## 2015-07-24 DIAGNOSIS — B1920 Unspecified viral hepatitis C without hepatic coma: Secondary | ICD-10-CM | POA: Diagnosis not present

## 2015-07-24 DIAGNOSIS — R1011 Right upper quadrant pain: Secondary | ICD-10-CM | POA: Diagnosis present

## 2015-07-24 DIAGNOSIS — M546 Pain in thoracic spine: Secondary | ICD-10-CM | POA: Diagnosis present

## 2015-07-24 HISTORY — DX: Unspecified viral hepatitis C without hepatic coma: B19.20

## 2015-07-24 LAB — COMPREHENSIVE METABOLIC PANEL
ALT: 41 U/L (ref 14–54)
ANION GAP: 10 (ref 5–15)
AST: 33 U/L (ref 15–41)
Albumin: 3.1 g/dL — ABNORMAL LOW (ref 3.5–5.0)
Alkaline Phosphatase: 167 U/L — ABNORMAL HIGH (ref 38–126)
BUN: <5 mg/dL — ABNORMAL LOW (ref 6–20)
CHLORIDE: 108 mmol/L (ref 101–111)
CO2: 18 mmol/L — ABNORMAL LOW (ref 22–32)
Calcium: 8.4 mg/dL — ABNORMAL LOW (ref 8.9–10.3)
Creatinine, Ser: 0.34 mg/dL — ABNORMAL LOW (ref 0.44–1.00)
Glucose, Bld: 80 mg/dL (ref 65–99)
POTASSIUM: 3.6 mmol/L (ref 3.5–5.1)
Sodium: 136 mmol/L (ref 135–145)
Total Bilirubin: 0.6 mg/dL (ref 0.3–1.2)
Total Protein: 6.8 g/dL (ref 6.5–8.1)

## 2015-07-24 LAB — CBC
HCT: 34.7 % — ABNORMAL LOW (ref 35.0–47.0)
Hemoglobin: 11.8 g/dL — ABNORMAL LOW (ref 12.0–16.0)
MCH: 28.3 pg (ref 26.0–34.0)
MCHC: 34 g/dL (ref 32.0–36.0)
MCV: 83.2 fL (ref 80.0–100.0)
PLATELETS: 135 10*3/uL — AB (ref 150–440)
RBC: 4.17 MIL/uL (ref 3.80–5.20)
RDW: 13.1 % (ref 11.5–14.5)
WBC: 11.5 10*3/uL — ABNORMAL HIGH (ref 3.6–11.0)

## 2015-07-24 LAB — LIPASE, BLOOD: LIPASE: 17 U/L — AB (ref 22–51)

## 2015-07-24 MED ORDER — OMEPRAZOLE 20 MG PO CPDR: 20.0000 mg | DELAYED_RELEASE_CAPSULE | Freq: Every day | ORAL | Status: DC

## 2015-07-24 MED ORDER — PROMETHAZINE HCL 25 MG PO TABS: 25.0000 mg | ORAL_TABLET | Freq: Four times a day (QID) | ORAL | Status: DC | PRN

## 2015-07-24 MED ORDER — ACETAMINOPHEN 325 MG PO TABS: 650.0000 mg | ORAL_TABLET | ORAL | Status: DC | PRN

## 2015-07-24 MED ORDER — PROMETHAZINE HCL 25 MG RE SUPP: 25.0000 mg | Freq: Four times a day (QID) | RECTAL | Status: DC | PRN

## 2015-07-24 MED ORDER — TERCONAZOLE 0.4 % VA CREA: 1.0000 | TOPICAL_CREAM | Freq: Every day | VAGINAL | Status: DC

## 2015-07-24 NOTE — H&P (Signed)
Obstetric H&P   Chief Complaint: Nausea, back pain  Prenatal Care Provider: [facility]  History of Present Illness: 24 y.o. G2P0010 38w0dby 08/28/2015,presenting to L&D with back pain and nausea and emesis.  Pain worse with eating, also significant po intolerance with eating.  No loose stools or fevers.  +FM, no LOF, no VB, no ctx.  Denies headaches, vision changes, RUQ or epigastric pain.    Labs 07/23/15: WBC 9.3K, H&H 11.2 & 33.3, Platelets 144K.  Mild elevation in ALT of 53 (Baseline ALT was 68 obtained given history of HCV).  Negative P/C ratio  PNC noteable for HepC, MTHFR mutation, mild thrombocytopenia on 28 week labs.    ABO, Rh: O pos Antibody: negative  Rubella: Immune Varicella: Immune RPR: NR HBsAg:  Negative HIV: Negative 1-hr: 109    Review of Systems: 10 point review of systems negative unless otherwise noted in HPI  Past Medical History: Past Medical History  Diagnosis Date  . Asthma   . GERD (gastroesophageal reflux disease)   . Hyperlipidemia   . Bipolar 2 disorder   . Circumvallate placenta   . Hepatitis C     Past Surgical History: Past Surgical History  Procedure Laterality Date  . Tonsillectomy  2007  . Appendectomy      2000  . Wisdom tooth extraction     Family History: Family History  Problem Relation Age of Onset  . Cancer Maternal Grandmother     colon  . Diabetes Maternal Grandfather   . Heart disease Paternal Grandmother   . Crohn's disease Paternal Grandmother   . Autoimmune disease Father   . Crohn's disease Father     Social History: Social History   Social History  . Marital Status: Married    Spouse Name: N/A  . Number of Children: N/A  . Years of Education: N/A   Occupational History  . Not on file.   Social History Main Topics  . Smoking status: Former Smoker    Quit date: 11/13/2014  . Smokeless tobacco: Never Used  . Alcohol Use: No  . Drug Use: No  . Sexual Activity: Yes    Birth Control/ Protection: None    Other Topics Concern  . Not on file   Social History Narrative    Medications: Prior to Admission medications   Medication Sig Start Date End Date Taking? Authorizing Provider  hydrOXYzine (ATARAX/VISTARIL) 25 MG tablet Take 25 mg by mouth 3 (three) times daily as needed. One in AM and 2 at night   Yes Historical Provider, MD  Prenatal Vit-Fe Fumarate-FA (MULTIVITAMIN-PRENATAL) 27-0.8 MG TABS tablet Take 1 tablet by mouth daily at 12 noon.   Yes Historical Provider, MD  ranitidine (ZANTAC) 150 MG tablet Take 150 mg by mouth daily.   Yes Historical Provider, MD    Allergies: Allergies  Allergen Reactions  . Flu Virus Vaccine Anaphylaxis  . Penicillins     Physical Exam: Vitals: Last menstrual period 11/20/2014.  Urine Dip Protein: N/A  FHT: 140, moderate, positive accels, no decels Toco: none  General: NAD HEENT: normocephalic, anicteric sclera Pulmonary: No increased work of breathing Abdomen: Gravid,  Non-tender, negative Murphy's sign Leopolds: breech  Genitourinary: Deferred MSK: no CVA tenderness Extremities: no edema  Labs: Results for orders placed or performed during the hospital encounter of 07/24/15 (from the past 72 hour(s))  CBC     Status: Abnormal   Collection Time: 07/24/15  6:33 PM  Result Value Ref Range   WBC 11.5 (H) 3.6 -  11.0 K/uL   RBC 4.17 3.80 - 5.20 MIL/uL   Hemoglobin 11.8 (L) 12.0 - 16.0 g/dL   HCT 34.7 (L) 35.0 - 47.0 %   MCV 83.2 80.0 - 100.0 fL   MCH 28.3 26.0 - 34.0 pg   MCHC 34.0 32.0 - 36.0 g/dL   RDW 13.1 11.5 - 14.5 %   Platelets 135 (L) 150 - 440 K/uL  Lipase, blood     Status: Abnormal   Collection Time: 07/24/15  6:33 PM  Result Value Ref Range   Lipase 17 (L) 22 - 51 U/L  Comprehensive metabolic panel     Status: Abnormal   Collection Time: 07/24/15  6:33 PM  Result Value Ref Range   Sodium 136 135 - 145 mmol/L   Potassium 3.6 3.5 - 5.1 mmol/L   Chloride 108 101 - 111 mmol/L   CO2 18 (L) 22 - 32 mmol/L   Glucose,  Bld 80 65 - 99 mg/dL   BUN <5 (L) 6 - 20 mg/dL   Creatinine, Ser 0.34 (L) 0.44 - 1.00 mg/dL   Calcium 8.4 (L) 8.9 - 10.3 mg/dL   Total Protein 6.8 6.5 - 8.1 g/dL   Albumin 3.1 (L) 3.5 - 5.0 g/dL   AST 33 15 - 41 U/L   ALT 41 14 - 54 U/L   Alkaline Phosphatase 167 (H) 38 - 126 U/L   Total Bilirubin 0.6 0.3 - 1.2 mg/dL   GFR calc non Af Amer >60 >60 mL/min   GFR calc Af Amer >60 >60 mL/min    Comment: (NOTE) The eGFR has been calculated using the CKD EPI equation. This calculation has not been validated in all clinical situations. eGFR's persistently <60 mL/min signify possible Chronic Kidney Disease.    Anion gap 10 5 - 15    Imaging US Abdomen Limited Ruq  07/24/2015   CLINICAL DATA:  Acute right upper quadrant abdominal pain.  EXAM: US ABDOMEN LIMITED - RIGHT UPPER QUADRANT  COMPARISON:  None.  FINDINGS: Gallbladder:  No gallstones or wall thickening visualized. No sonographic Murphy sign noted.  Common bile duct:  Diameter: 2.3 mm which is within normal limits.  Liver:  No focal lesion identified. Within normal limits in parenchymal echogenicity.  IMPRESSION: No abnormality seen in the right upper quadrant of the abdomen.   Electronically Signed   By: Marijo Conception, M.D.   On: 07/24/2015 20:12    Assessment: 24 y.o. G2P0010 82w0dby 08/28/2015, presenting with likely gastroenteritis  Plan: 1) Gastroenteritis  - start on omeprazole for GERD -   2) Fetus - reactive NST, category I tracing  3) Disposition - discharge home follow up on 07/26/15

## 2015-07-24 NOTE — OB Triage Note (Signed)
Right upper back pain starting 07-23-15 around 10 PM. Pain worse with meals. Sleep and rest improves.

## 2015-07-26 ENCOUNTER — Encounter (HOSPITAL_COMMUNITY): Payer: Self-pay

## 2015-07-27 ENCOUNTER — Ambulatory Visit (INDEPENDENT_AMBULATORY_CARE_PROVIDER_SITE_OTHER): Payer: Commercial Managed Care - HMO | Admitting: Family Medicine

## 2015-07-27 ENCOUNTER — Encounter: Payer: Self-pay | Admitting: Family Medicine

## 2015-07-27 VITALS — BP 122/83 | HR 92 | Temp 98.1°F | Wt 219.0 lb

## 2015-07-27 DIAGNOSIS — M9903 Segmental and somatic dysfunction of lumbar region: Secondary | ICD-10-CM

## 2015-07-27 DIAGNOSIS — M9905 Segmental and somatic dysfunction of pelvic region: Secondary | ICD-10-CM

## 2015-07-27 DIAGNOSIS — M9902 Segmental and somatic dysfunction of thoracic region: Secondary | ICD-10-CM

## 2015-07-27 DIAGNOSIS — Z331 Pregnant state, incidental: Secondary | ICD-10-CM | POA: Diagnosis not present

## 2015-07-27 DIAGNOSIS — M5441 Lumbago with sciatica, right side: Secondary | ICD-10-CM

## 2015-07-27 DIAGNOSIS — M9904 Segmental and somatic dysfunction of sacral region: Secondary | ICD-10-CM

## 2015-07-27 DIAGNOSIS — M9901 Segmental and somatic dysfunction of cervical region: Secondary | ICD-10-CM | POA: Diagnosis not present

## 2015-07-27 DIAGNOSIS — Z3490 Encounter for supervision of normal pregnancy, unspecified, unspecified trimester: Secondary | ICD-10-CM

## 2015-07-27 NOTE — Assessment & Plan Note (Signed)
This seems to be in exacerbation because of unloading cabinets. Baby appears to have moved, putting less pressure on the R side. 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 On limited activity due to placental location, She does appear to have some somatic dysfunction that is contributing to her symptoms and I think she would benefit from OMT. Patient treated today as discussed below.

## 2015-07-27 NOTE — Assessment & Plan Note (Signed)
Progressing well. Almost 36 weeks. Continue to follow with GYN. Continue to monitor.
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
## 2015-08-03 ENCOUNTER — Encounter: Payer: Self-pay | Admitting: Family Medicine

## 2015-08-03 ENCOUNTER — Ambulatory Visit (INDEPENDENT_AMBULATORY_CARE_PROVIDER_SITE_OTHER): Payer: Commercial Managed Care - HMO | Admitting: Family Medicine

## 2015-08-03 VITALS — BP 115/82 | HR 98 | Temp 98.3°F | Wt 222.0 lb

## 2015-08-03 DIAGNOSIS — M9903 Segmental and somatic dysfunction of lumbar region: Secondary | ICD-10-CM

## 2015-08-03 DIAGNOSIS — M9909 Segmental and somatic dysfunction of abdomen and other regions: Secondary | ICD-10-CM

## 2015-08-03 DIAGNOSIS — M9904 Segmental and somatic dysfunction of sacral region: Secondary | ICD-10-CM | POA: Diagnosis not present

## 2015-08-03 DIAGNOSIS — M9908 Segmental and somatic dysfunction of rib cage: Secondary | ICD-10-CM

## 2015-08-03 DIAGNOSIS — Z331 Pregnant state, incidental: Secondary | ICD-10-CM

## 2015-08-03 DIAGNOSIS — M5441 Lumbago with sciatica, right side: Secondary | ICD-10-CM

## 2015-08-03 DIAGNOSIS — M9905 Segmental and somatic dysfunction of pelvic region: Secondary | ICD-10-CM

## 2015-08-03 DIAGNOSIS — M999 Biomechanical lesion, unspecified: Secondary | ICD-10-CM

## 2015-08-03 DIAGNOSIS — M9902 Segmental and somatic dysfunction of thoracic region: Secondary | ICD-10-CM

## 2015-08-03 DIAGNOSIS — Z3490 Encounter for supervision of normal pregnancy, unspecified, unspecified trimester: Secondary | ICD-10-CM

## 2015-08-03 NOTE — Progress Notes (Signed)
BP 115/82 mmHg  Pulse 98  Temp(Src) 98.3 F (36.8 C)  Wt 222 lb (100.699 kg)  SpO2 98%  LMP 11/20/2014   Subjective:    Patient ID: Shelley Aguirre, female    DOB: [birth year], 24 y.o.   MRN: [id number]  HPI: Shelley Aguirre is a 24 y.o. female  Chief Complaint  Patient presents with  . Back Pain    OMM   Shelley Aguirre states that she is ready for this baby to come out! Has been having some increase hip and diaphragm pain as he still hasn't turned and is putting a lot of pressure there on the R side. She is going in for a version next week and hopes that gets him out of breach. She states that she does well with OMT and would like to continue. No N/V, no numbness or tingling. Pain is in R diaphragm and R hip with no radiation at this time. Constant. Some better with rest. Worse with walking around and getting the baby room set up. Aching and shooting. No other concerns or complaints. Otherwise feeling well.   Relevant past medical, surgical, family and social history reviewed and updated as indicated. Interim medical history since our last visit reviewed. Allergies and medications reviewed and updated.  Review of Systems  Constitutional: Negative.   Respiratory: Negative.   Cardiovascular: Negative.   Musculoskeletal: Negative.   Psychiatric/Behavioral: Negative.     Per HPI unless specifically indicated above     Objective:    BP 115/82 mmHg  Pulse 98  Temp(Src) 98.3 F (36.8 C)  Wt 222 lb (100.699 kg)  SpO2 98%  LMP 11/20/2014  Wt Readings from Last 3 Encounters:  08/03/15 222 lb (100.699 kg)  07/27/15 219 lb (99.338 kg)  07/09/15 220 lb 9.6 oz (100.064 kg)    Physical Exam  Constitutional: She is oriented to person, place, and time. She appears well-developed and well-nourished. No distress.  HENT:  Head: Normocephalic and atraumatic.  Right Ear: Hearing normal.  Left Ear: Hearing normal.  Nose: Nose normal.  Eyes: Conjunctivae and lids are normal. Right eye exhibits  no discharge. Left eye exhibits no discharge. No scleral icterus.  Pulmonary/Chest: Effort normal. No respiratory distress.  Abdominal: Soft.  gravid  Neurological: She is alert and oriented to person, place, and time.  Skin: Skin is warm, dry and intact. No rash noted. No erythema. No pallor.  Psychiatric: She has a normal mood and affect. Her speech is normal and behavior is normal. Judgment and thought content normal. Cognition and memory are normal.   Musculoskeletal:  Exam found Decreased ROM, Tissue texture changes, Tenderness to palpation and Asymmetry of patient's  thorax, ribs, lumbar, pelvis, sacrum, upper extremity and abdomen Osteopathic Structural Exam:   Thorax: T5-9 hypertonic on the R, periscapular hypertonicity bilaterally R>L  Ribs: Ribs 7-9 locked up on the R, Rib 1 locked up on the R  Lumbar: QL hypertonic on the R, L5ESRR  Pelvis: Posterior R innominate  Sacrum: L on L torsion  Upper Extremity: Clavicle restricted on the R  Abdomen: R diaphragm restricted, mesentary restricted  Results for orders placed or performed during the hospital encounter of 07/24/15  CBC  Result Value Ref Range   WBC 11.5 (H) 3.6 - 11.0 K/uL   RBC 4.17 3.80 - 5.20 MIL/uL   Hemoglobin 11.8 (L) 12.0 - 16.0 g/dL   HCT 08.6 (L) 57.8 - 46.9 %   MCV 83.2 80.0 - 100.0 fL   MCH  28.3 26.0 - 34.0 pg   MCHC 34.0 32.0 - 36.0 g/dL   RDW 16.1 09.6 - 04.5 %   Platelets 135 (L) 150 - 440 K/uL  Lipase, blood  Result Value Ref Range   Lipase 17 (L) 22 - 51 U/L  Comprehensive metabolic panel  Result Value Ref Range   Sodium 136 135 - 145 mmol/L   Potassium 3.6 3.5 - 5.1 mmol/L   Chloride 108 101 - 111 mmol/L   CO2 18 (L) 22 - 32 mmol/L   Glucose, Bld 80 65 - 99 mg/dL   BUN <5 (L) 6 - 20 mg/dL   Creatinine, Ser 4.09 (L) 0.44 - 1.00 mg/dL   Calcium 8.4 (L) 8.9 - 10.3 mg/dL   Total Protein 6.8 6.5 - 8.1 g/dL   Albumin 3.1 (L) 3.5 - 5.0 g/dL   AST 33 15 - 41 U/L   ALT 41 14 - 54 U/L   Alkaline  Phosphatase 167 (H) 38 - 126 U/L   Total Bilirubin 0.6 0.3 - 1.2 mg/dL   GFR calc non Af Amer >60 >60 mL/min   GFR calc Af Amer >60 >60 mL/min   Anion gap 10 5 - 15      Assessment & Plan:   Problem List Items Addressed This Visit      Other   Low back pain - Primary    This seems to be in a new state of stability as baby is still breach and putting a lot pressure on her diaphragm. Patient appears to have low back pain likely due to muscular spasm caused primarily by her pregnancy and fetal lie. On limited activity due to placental location, She does appear to have some somatic dysfunction that is contributing to her symptoms and I think she would benefit from OMT. Patient treated today as discussed below.                     Pregnancy    Progressing well. Almost 37 weeks. Continue to follow with GYN. Continue to monitor. To have version next week.                 Other Visit Diagnoses    Segmental dysfunction of pelvic region        Nonallopathic lesion of rib cage        Nonallopathic lesion of lumbar region        Somatic dysfunction of abdominal region        Thoracic segment dysfunction        Somatic dysfunction of spine, sacral          After verbal consent was obtained, patient was treated today with osteopathic manipulative medicine to the regions of the thorax, ribs, lumbar, pelvis, sacrum and abdomen using the techniques of Still, FPR, myofascial release, counterstrain, muscle energy, HVLA and soft tissue. Areas of compensation relating to her primary pain source also treated. Patient tolerated the procedure well with good objective and good subjective improvement in symptoms. She left the room in good condition. She was advised to stay well hydrated and that she may have some soreness following the procedure. If not improving or worsening, she will call and come in. She will return for reevaluation  in 1-2 weeks.   Follow up plan: Return in about 1  week (around 08/10/2015).

## 2015-08-03 NOTE — Assessment & Plan Note (Signed)
Progressing well. Almost 37 weeks. Continue to follow with GYN. Continue to monitor. To have version next week.

## 2015-08-03 NOTE — Assessment & Plan Note (Signed)
This seems to be in a new state of stability as baby is still breach and putting a lot pressure on her diaphragm. Patient appears to have low back pain likely due to muscular spasm caused primarily by her pregnancy and fetal lie. On limited activity due to placental location, She does appear to have some somatic dysfunction that is contributing to her symptoms and I think she would benefit from OMT. Patient treated today as discussed below.

## 2015-08-10 ENCOUNTER — Ambulatory Visit (INDEPENDENT_AMBULATORY_CARE_PROVIDER_SITE_OTHER): Payer: Commercial Managed Care - HMO | Admitting: Family Medicine

## 2015-08-10 ENCOUNTER — Encounter: Payer: Self-pay | Admitting: Family Medicine

## 2015-08-10 VITALS — BP 115/83 | HR 88 | Temp 98.5°F | Ht 65.4 in | Wt 224.0 lb

## 2015-08-10 DIAGNOSIS — Z331 Pregnant state, incidental: Secondary | ICD-10-CM

## 2015-08-10 DIAGNOSIS — M9903 Segmental and somatic dysfunction of lumbar region: Secondary | ICD-10-CM | POA: Diagnosis not present

## 2015-08-10 DIAGNOSIS — M9905 Segmental and somatic dysfunction of pelvic region: Secondary | ICD-10-CM | POA: Diagnosis not present

## 2015-08-10 DIAGNOSIS — M9904 Segmental and somatic dysfunction of sacral region: Secondary | ICD-10-CM | POA: Diagnosis not present

## 2015-08-10 DIAGNOSIS — Z3490 Encounter for supervision of normal pregnancy, unspecified, unspecified trimester: Secondary | ICD-10-CM

## 2015-08-10 DIAGNOSIS — M9909 Segmental and somatic dysfunction of abdomen and other regions: Secondary | ICD-10-CM

## 2015-08-10 DIAGNOSIS — M5441 Lumbago with sciatica, right side: Secondary | ICD-10-CM | POA: Diagnosis not present

## 2015-08-10 DIAGNOSIS — M9900 Segmental and somatic dysfunction of head region: Secondary | ICD-10-CM | POA: Diagnosis not present

## 2015-08-10 DIAGNOSIS — M9902 Segmental and somatic dysfunction of thoracic region: Secondary | ICD-10-CM

## 2015-08-10 NOTE — Assessment & Plan Note (Signed)
Greatly improved since the baby is no longer breach. Patient appears to have low back pain likely due to muscular spasm caused primarily by her pregnancy and fetal lie. On limited activity due to placental location, She does appear to have some somatic dysfunction that is contributing to her symptoms and I think she would benefit from OMT. Patient treated today as discussed below.

## 2015-08-10 NOTE — Progress Notes (Signed)
BP 115/83 mmHg  Pulse 88  Temp(Src) 98.5 F (36.9 C)  Ht 5' 5.4" (1.661 m)  Wt 224 lb (101.606 kg)  BMI 36.83 kg/m2  SpO2 99%  LMP 11/20/2014   Subjective:    Patient ID: Shelley Aguirre, female    DOB: [birth year], 24 y.o.   MRN: [id number]  HPI: Shelley Aguirre is a 24 y.o. female  Chief Complaint  Patient presents with  . Back Pain   Adelaine is doing well. Notes that the baby turned on his own! They are happy for him to come at any time now. Saw the GYN yesterday and had her cervix checked. Starting to thin, but not dilated yet. She notes that many of her symptoms got better with her baby turning. Her back is still acting up, but her GERD and her breathing is a lot better. She feels some discomfort in her R low back and her shoulders from getting ready for him. No radiation. No numbness or tingling. Chronic. She finds OMT to be helpful and was feeling better until he turned. She would like to be treated again today. She is otherwise feeling well with no other concerns or complaints at this time.   Relevant past medical, surgical, family and social history reviewed and updated as indicated. Interim medical history since our last visit reviewed. Allergies and medications reviewed and updated.  Review of Systems  Constitutional: Negative.   Respiratory: Negative.   Cardiovascular: Negative.   Gastrointestinal: Negative.   Musculoskeletal: Positive for myalgias, back pain, gait problem and neck stiffness. Negative for joint swelling, arthralgias and neck pain.  Psychiatric/Behavioral: Negative.     Per HPI unless specifically indicated above     Objective:    BP 115/83 mmHg  Pulse 88  Temp(Src) 98.5 F (36.9 C)  Ht 5' 5.4" (1.661 m)  Wt 224 lb (101.606 kg)  BMI 36.83 kg/m2  SpO2 99%  LMP 11/20/2014  Wt Readings from Last 3 Encounters:  08/10/15 224 lb (101.606 kg)  08/03/15 222 lb (100.699 kg)  07/27/15 219 lb (99.338 kg)    Physical Exam  Constitutional: She is oriented  to person, place, and time. She appears well-developed and well-nourished. No distress.  HENT:  Head: Normocephalic and atraumatic.  Right Ear: Hearing normal.  Left Ear: Hearing normal.  Nose: Nose normal.  Eyes: Conjunctivae and lids are normal. Right eye exhibits no discharge. Left eye exhibits no discharge. No scleral icterus.  Pulmonary/Chest: Effort normal. No respiratory distress.  Abdominal: Soft. Bowel sounds are normal. She exhibits no distension and no mass. There is no tenderness. There is no rebound and no guarding.  gravid  Musculoskeletal: Normal range of motion.  Neurological: She is alert and oriented to person, place, and time.  Skin: Skin is warm, dry and intact. No rash noted. No erythema. No pallor.  Psychiatric: She has a normal mood and affect. Her speech is normal and behavior is normal. Judgment and thought content normal. Cognition and memory are normal.  Nursing note and vitals reviewed. Musculoskeletal:  Exam found Decreased ROM, Tissue texture changes, Tenderness to palpation and Asymmetry of patient's  head, thorax, lumbar, pelvis, sacrum and abdomen Osteopathic Structural Exam:   Head: R torsion  Thorax: T 5-7SLRR, periscapular hypertonicity on the R  Lumbar: QL spasm on the R, psoas spasm on the R  Pelvis: Anterior R innominate  Sacrum: R on R torsion  Abdomen: diaphragm hypertonic bilaterally L>R   Results for orders placed or performed during the  hospital encounter of 07/24/15  CBC  Result Value Ref Range   WBC 11.5 (H) 3.6 - 11.0 K/uL   RBC 4.17 3.80 - 5.20 MIL/uL   Hemoglobin 11.8 (L) 12.0 - 16.0 g/dL   HCT 16.1 (L) 09.6 - 04.5 %   MCV 83.2 80.0 - 100.0 fL   MCH 28.3 26.0 - 34.0 pg   MCHC 34.0 32.0 - 36.0 g/dL   RDW 40.9 81.1 - 91.4 %   Platelets 135 (L) 150 - 440 K/uL  Lipase, blood  Result Value Ref Range   Lipase 17 (L) 22 - 51 U/L  Comprehensive metabolic panel  Result Value Ref Range   Sodium 136 135 - 145 mmol/L   Potassium 3.6 3.5  - 5.1 mmol/L   Chloride 108 101 - 111 mmol/L   CO2 18 (L) 22 - 32 mmol/L   Glucose, Bld 80 65 - 99 mg/dL   BUN <5 (L) 6 - 20 mg/dL   Creatinine, Ser 7.82 (L) 0.44 - 1.00 mg/dL   Calcium 8.4 (L) 8.9 - 10.3 mg/dL   Total Protein 6.8 6.5 - 8.1 g/dL   Albumin 3.1 (L) 3.5 - 5.0 g/dL   AST 33 15 - 41 U/L   ALT 41 14 - 54 U/L   Alkaline Phosphatase 167 (H) 38 - 126 U/L   Total Bilirubin 0.6 0.3 - 1.2 mg/dL   GFR calc non Af Amer >60 >60 mL/min   GFR calc Af Amer >60 >60 mL/min   Anion gap 10 5 - 15      Assessment & Plan:   Problem List Items Addressed This Visit      Other   Low back pain - Primary    Greatly improved since the baby is no longer breach. Patient appears to have low back pain likely due to muscular spasm caused primarily by her pregnancy and fetal lie. On limited activity due to placental location, She does appear to have some somatic dysfunction that is contributing to her symptoms and I think she would benefit from OMT. Patient treated today as discussed below.                       Pregnancy    Progressing well. Almost 38 weeks. Continue to follow with GYN. Continue to monitor. Now vertex. CV4 performed                Other Visit Diagnoses    Somatic dysfunction of abdominal region        Somatic dysfunction of head region        Thoracic segment dysfunction        Segmental dysfunction of pelvic region        Somatic dysfunction of spine, sacral        Somatic dysfunction of spine, lumbar          After verbal consent was obtained, patient was treated today with osteopathic manipulative medicine to the regions of the head, thorax, lumbar, pelvis, sacrum and abdomen using the techniques of cranial, FPR, myofascial release, muscle energy and soft tissueCV4 performed to help induce labor. Areas of compensation relating to her primary pain source also treated. Patient tolerated the procedure well with good objective and good subjective  improvement in symptoms. She left the room in good condition. She was advised to stay well hydrated and that she may have some soreness following the procedure. If not improving or worsening, she will call and come in.  She will return for reevaluation  in 1-2 weeks.   Follow up plan: Return in about 1 week (around 08/17/2015).

## 2015-08-10 NOTE — Assessment & Plan Note (Signed)
Progressing well. Almost 38 weeks. Continue to follow with GYN. Continue to monitor. Now vertex. CV4 performed

## 2015-08-12 ENCOUNTER — Telehealth: Payer: Self-pay | Admitting: *Deleted

## 2015-08-17 ENCOUNTER — Ambulatory Visit: Payer: Self-pay | Admitting: Family Medicine

## 2015-08-18 ENCOUNTER — Ambulatory Visit (INDEPENDENT_AMBULATORY_CARE_PROVIDER_SITE_OTHER): Payer: Commercial Managed Care - HMO | Admitting: Family Medicine

## 2015-08-18 ENCOUNTER — Encounter: Payer: Self-pay | Admitting: Family Medicine

## 2015-08-18 VITALS — BP 114/81 | HR 86 | Temp 98.4°F | Ht 66.2 in | Wt 228.0 lb

## 2015-08-18 DIAGNOSIS — M5441 Lumbago with sciatica, right side: Secondary | ICD-10-CM

## 2015-08-18 DIAGNOSIS — Z331 Pregnant state, incidental: Secondary | ICD-10-CM | POA: Diagnosis not present

## 2015-08-18 DIAGNOSIS — M9904 Segmental and somatic dysfunction of sacral region: Secondary | ICD-10-CM

## 2015-08-18 DIAGNOSIS — M9903 Segmental and somatic dysfunction of lumbar region: Secondary | ICD-10-CM

## 2015-08-18 DIAGNOSIS — M9900 Segmental and somatic dysfunction of head region: Secondary | ICD-10-CM | POA: Diagnosis not present

## 2015-08-18 DIAGNOSIS — M9909 Segmental and somatic dysfunction of abdomen and other regions: Secondary | ICD-10-CM | POA: Diagnosis not present

## 2015-08-18 DIAGNOSIS — M9905 Segmental and somatic dysfunction of pelvic region: Secondary | ICD-10-CM

## 2015-08-18 DIAGNOSIS — Z3490 Encounter for supervision of normal pregnancy, unspecified, unspecified trimester: Secondary | ICD-10-CM

## 2015-08-18 NOTE — Assessment & Plan Note (Signed)
Greatly improved since the baby is no longer breach, however, she is now having cramping, which may be braxton hicks. Patient appears to have low back pain likely due to muscular spasm caused primarily by her pregnancy and fetal lie. On limited activity due to placental location, She does appear to have some somatic dysfunction that is contributing to her symptoms and I think she would benefit from OMT. Patient treated today as discussed below.

## 2015-08-18 NOTE — Progress Notes (Signed)
BP 114/81 mmHg  Pulse 86  Temp(Src) 98.4 F (36.9 C)  Ht 5' 6.2" (1.681 m)  Wt 228 lb (103.42 kg)  BMI 36.60 kg/m2  SpO2 100%  LMP 11/20/2014   Subjective:    Patient ID: Shelley Aguirre, female    DOB: Jul 15, 1991, 24 y.o.   MRN: [id number]  HPI: Shelley Aguirre is a 24 y.o. female  Chief Complaint  Patient presents with  . Back Pain    OMM   Shelley Aguirre states that she is hanging in there. She cannot wait for this baby to be born. She notes that she feels a lot of cramping in her hips and an increase amount of pain in her low back. She has not noticed any contractions. She goes back to see her OBGYN on Monday and is going to have stripping donw is she hasn't gone into labor yet at that time. She is almost 39 weeks right now. She denies any numbness or tingling. Denies any abdominal pain. Notes that her GERD has gotten much better since the baby flipped. She is otherwise doing well with no other concerns or complaints at this time.   Relevant past medical, surgical, family and social history reviewed and updated as indicated. Interim medical history since our last visit reviewed. Allergies and medications reviewed and updated.  Review of Systems  Constitutional: Negative.   Respiratory: Negative.   Cardiovascular: Negative.   Musculoskeletal: Positive for myalgias and back pain. Negative for joint swelling, arthralgias, gait problem, neck pain and neck stiffness.  Psychiatric/Behavioral: Negative.     Per HPI unless specifically indicated above     Objective:    BP 114/81 mmHg  Pulse 86  Temp(Src) 98.4 F (36.9 C)  Ht 5' 6.2" (1.681 m)  Wt 228 lb (103.42 kg)  BMI 36.60 kg/m2  SpO2 100%  LMP 11/20/2014  Wt Readings from Last 3 Encounters:  08/18/15 228 lb (103.42 kg)  08/10/15 224 lb (101.606 kg)  08/03/15 222 lb (100.699 kg)    Physical Exam  Constitutional: She is oriented to person, place, and time. She appears well-developed and well-nourished. No distress.  HENT:   Head: Normocephalic and atraumatic.  Right Ear: Hearing normal.  Left Ear: Hearing normal.  Nose: Nose normal.  Eyes: Conjunctivae and lids are normal. Right eye exhibits no discharge. Left eye exhibits no discharge. No scleral icterus.  Cardiovascular: Normal rate.   Pulmonary/Chest: Effort normal. No respiratory distress.  Abdominal: Soft. She exhibits no distension and no mass. There is no tenderness. There is no rebound and no guarding.  gravid  Neurological: She is alert and oriented to person, place, and time.  Skin: Skin is warm, dry and intact. No rash noted. No erythema. No pallor.  Psychiatric: She has a normal mood and affect. Her speech is normal and behavior is normal. Judgment and thought content normal. Cognition and memory are normal.  Nursing note and vitals reviewed. Musculoskeletal:  Exam found Decreased ROM, Tissue texture changes, Tenderness to palpation and Asymmetry of patient's  head, lumbar, pelvis, sacrum and abdomen Osteopathic Structural Exam:   Head: hypertonic suboccipital muscles  Lumbar: L4-5SRRL, QL spasm on the R, psoas spasm on the R  Pelvis: Posterior R innominate, pubis contracted, SI joint restricted on the R  Sacrum: R on R torsion, SI joint restricted on the R  Abdomen: diaphragm spasm bilaterally L>R  Results for orders placed or performed during the hospital encounter of 07/24/15  CBC  Result Value Ref Range  WBC 11.5 (H) 3.6 - 11.0 K/uL   RBC 4.17 3.80 - 5.20 MIL/uL   Hemoglobin 11.8 (L) 12.0 - 16.0 g/dL   HCT 16.1 (L) 09.6 - 04.5 %   MCV 83.2 80.0 - 100.0 fL   MCH 28.3 26.0 - 34.0 pg   MCHC 34.0 32.0 - 36.0 g/dL   RDW 40.9 81.1 - 91.4 %   Platelets 135 (L) 150 - 440 K/uL  Lipase, blood  Result Value Ref Range   Lipase 17 (L) 22 - 51 U/L  Comprehensive metabolic panel  Result Value Ref Range   Sodium 136 135 - 145 mmol/L   Potassium 3.6 3.5 - 5.1 mmol/L   Chloride 108 101 - 111 mmol/L   CO2 18 (L) 22 - 32 mmol/L   Glucose, Bld 80  65 - 99 mg/dL   BUN <5 (L) 6 - 20 mg/dL   Creatinine, Ser 7.82 (L) 0.44 - 1.00 mg/dL   Calcium 8.4 (L) 8.9 - 10.3 mg/dL   Total Protein 6.8 6.5 - 8.1 g/dL   Albumin 3.1 (L) 3.5 - 5.0 g/dL   AST 33 15 - 41 U/L   ALT 41 14 - 54 U/L   Alkaline Phosphatase 167 (H) 38 - 126 U/L   Total Bilirubin 0.6 0.3 - 1.2 mg/dL   GFR calc non Af Amer >60 >60 mL/min   GFR calc Af Amer >60 >60 mL/min   Anion gap 10 5 - 15      Assessment & Plan:   Problem List Items Addressed This Visit      Other   Low back pain - Primary    Greatly improved since the baby is no longer breach, however, she is now having cramping, which may be braxton hicks. Patient appears to have low back pain likely due to muscular spasm caused primarily by her pregnancy and fetal lie. On limited activity due to placental location, She does appear to have some somatic dysfunction that is contributing to her symptoms and I think she would benefit from OMT. Patient treated today as discussed below.                         Pregnancy    Progressing well. Almost 39 weeks. Continue to follow with GYN. Continue to monitor. Now vertex.                   Other Visit Diagnoses    Somatic dysfunction of spine, sacral        Somatic dysfunction of spine, lumbar        Somatic dysfunction of abdominal region        Somatic dysfunction of head region        Segmental dysfunction of pelvic region          After verbal consent was obtained, patient was treated today with osteopathic manipulative medicine to the regions of the head, lumbar, pelvis, sacrum and abdomen using the techniques of FPR, myofascial release, counterstrain, muscle energy and soft tissue. Areas of compensation relating to her primary pain source also treated. Patient tolerated the procedure well with good objective and good subjective improvement in symptoms. She left the room in good condition. She was advised to stay well hydrated and that she  may have some soreness following the procedure. If not improving or worsening, she will call and come in. She will return for reevaluation  in 1-2 weeks if not delivered yet.  Follow up plan: Return in about 1 week (around 08/25/2015).

## 2015-08-18 NOTE — Assessment & Plan Note (Signed)
Progressing well. Almost 39 weeks. Continue to follow with GYN. Continue to monitor. Now vertex.

## 2015-08-24 ENCOUNTER — Ambulatory Visit (INDEPENDENT_AMBULATORY_CARE_PROVIDER_SITE_OTHER): Payer: Commercial Managed Care - HMO | Admitting: Family Medicine

## 2015-08-24 ENCOUNTER — Encounter: Payer: Self-pay | Admitting: Family Medicine

## 2015-08-24 VITALS — BP 128/89 | HR 99 | Temp 98.6°F | Wt 234.0 lb

## 2015-08-24 DIAGNOSIS — M9904 Segmental and somatic dysfunction of sacral region: Secondary | ICD-10-CM

## 2015-08-24 DIAGNOSIS — M9905 Segmental and somatic dysfunction of pelvic region: Secondary | ICD-10-CM

## 2015-08-24 DIAGNOSIS — M5441 Lumbago with sciatica, right side: Secondary | ICD-10-CM

## 2015-08-24 DIAGNOSIS — M9903 Segmental and somatic dysfunction of lumbar region: Secondary | ICD-10-CM

## 2015-08-24 DIAGNOSIS — Z331 Pregnant state, incidental: Secondary | ICD-10-CM | POA: Diagnosis not present

## 2015-08-24 DIAGNOSIS — M9900 Segmental and somatic dysfunction of head region: Secondary | ICD-10-CM | POA: Diagnosis not present

## 2015-08-24 DIAGNOSIS — M9909 Segmental and somatic dysfunction of abdomen and other regions: Secondary | ICD-10-CM

## 2015-08-24 DIAGNOSIS — Z3490 Encounter for supervision of normal pregnancy, unspecified, unspecified trimester: Secondary | ICD-10-CM

## 2015-08-24 NOTE — Assessment & Plan Note (Signed)
Greatly improved since the baby is no longer breach, however, she is now having cramping, which may be braxton hicks. Patient appears to have low back pain likely due to muscular spasm caused primarily by her pregnancy and fetal lie. On limited activity due to placental location, She does appear to have some somatic dysfunction that is contributing to her symptoms and I think she would benefit from OMT. Patient treated today as discussed below.                    

## 2015-08-24 NOTE — Assessment & Plan Note (Signed)
Progressing well. Almost 40 weeks. Continue to follow with GYN. Continue to monitor. To schedule induction on Monday. CV4 performed today.

## 2015-08-24 NOTE — Progress Notes (Signed)
BP 128/89 mmHg  Pulse 99  Temp(Src) 98.6 F (37 C)  Wt 234 lb (106.142 kg)  SpO2 99%  LMP 11/20/2014   Subjective:    Patient ID: Shelley Aguirre, female    DOB: 1991/03/18, 24 y.o.   MRN: [id number]  HPI: Shelley Aguirre is a 24 y.o. female  Chief Complaint  Patient presents with  . Back Pain    Doing well. Has gained about 10lbs this week due to fluid. Saw OB yesterday, who did not want to strip her membranes. Still only 1cm dilated. Starting to have more cramping last night. Notes that she is having some pain in her R hip, that is radiating into her back and into her R leg. It's better with rest and worse with a lot of movement and when her baby pushes on it. She notes that she is otherwise feeling well. She finds OMT helpful and would like to continue with it.   Relevant past medical, surgical, family and social history reviewed and updated as indicated. Interim medical history since our last visit reviewed. Allergies and medications reviewed and updated.  Review of Systems  Constitutional: Negative.   Respiratory: Negative.   Cardiovascular: Negative.   Gastrointestinal: Negative.   Musculoskeletal: Negative.   Psychiatric/Behavioral: Negative.     Per HPI unless specifically indicated above     Objective:    BP 128/89 mmHg  Pulse 99  Temp(Src) 98.6 F (37 C)  Wt 234 lb (106.142 kg)  SpO2 99%  LMP 11/20/2014  Wt Readings from Last 3 Encounters:  08/24/15 234 lb (106.142 kg)  08/18/15 228 lb (103.42 kg)  08/10/15 224 lb (101.606 kg)    Physical Exam  Constitutional: She is oriented to person, place, and time. She appears well-developed and well-nourished. No distress.  HENT:  Head: Normocephalic and atraumatic.  Right Ear: Hearing normal.  Left Ear: Hearing normal.  Nose: Nose normal.  Eyes: Conjunctivae and lids are normal. Right eye exhibits no discharge. Left eye exhibits no discharge. No scleral icterus.  Pulmonary/Chest: Effort normal. No respiratory  distress.  Abdominal: She exhibits no distension and no mass. There is no tenderness. There is no rebound and no guarding.  gravid  Neurological: She is alert and oriented to person, place, and time.  Skin: Skin is intact. No rash noted. She is not diaphoretic.  Psychiatric: She has a normal mood and affect. Her speech is normal and behavior is normal. Judgment and thought content normal. Cognition and memory are normal.  Nursing note and vitals reviewed. Musculoskeletal:  Exam found Decreased ROM, Tissue texture changes, Tenderness to palpation and Asymmetry of patient's  head, lumbar, pelvis, sacrum and abdomen Osteopathic Structural Exam:   Head: hypertonic suboccipital muscles, venous sinus congestion  Lumbar: QL hypertonic on the R, L5ESRL  Pelvis: Posterior R innominate  Sacrum: R on R torsion, SI joint restricted on the R  Abdomen: Pelvic diaphragm pulled to the R with fascial drag into R hip   Results for orders placed or performed during the hospital encounter of 07/24/15  CBC  Result Value Ref Range   WBC 11.5 (H) 3.6 - 11.0 K/uL   RBC 4.17 3.80 - 5.20 MIL/uL   Hemoglobin 11.8 (L) 12.0 - 16.0 g/dL   HCT 62.1 (L) 30.8 - 65.7 %   MCV 83.2 80.0 - 100.0 fL   MCH 28.3 26.0 - 34.0 pg   MCHC 34.0 32.0 - 36.0 g/dL   RDW 84.6 96.2 - 95.2 %  Platelets 135 (L) 150 - 440 K/uL  Lipase, blood  Result Value Ref Range   Lipase 17 (L) 22 - 51 U/L  Comprehensive metabolic panel  Result Value Ref Range   Sodium 136 135 - 145 mmol/L   Potassium 3.6 3.5 - 5.1 mmol/L   Chloride 108 101 - 111 mmol/L   CO2 18 (L) 22 - 32 mmol/L   Glucose, Bld 80 65 - 99 mg/dL   BUN <5 (L) 6 - 20 mg/dL   Creatinine, Ser 1.61 (L) 0.44 - 1.00 mg/dL   Calcium 8.4 (L) 8.9 - 10.3 mg/dL   Total Protein 6.8 6.5 - 8.1 g/dL   Albumin 3.1 (L) 3.5 - 5.0 g/dL   AST 33 15 - 41 U/L   ALT 41 14 - 54 U/L   Alkaline Phosphatase 167 (H) 38 - 126 U/L   Total Bilirubin 0.6 0.3 - 1.2 mg/dL   GFR calc non Af Amer >60 >60  mL/min   GFR calc Af Amer >60 >60 mL/min   Anion gap 10 5 - 15      Assessment & Plan:   Problem List Items Addressed This Visit      Other   Low back pain    Greatly improved since the baby is no longer breach, however, she is now having cramping, which may be braxton hicks. Patient appears to have low back pain likely due to muscular spasm caused primarily by her pregnancy and fetal lie. On limited activity due to placental location, She does appear to have some somatic dysfunction that is contributing to her symptoms and I think she would benefit from OMT. Patient treated today as discussed below.       Pregnancy - Primary    Progressing well. Almost 40 weeks. Continue to follow with GYN. Continue to monitor. To schedule induction on Monday. CV4 performed today.         Other Visit Diagnoses    Segmental dysfunction of pelvic region        Somatic dysfunction of spine, sacral        Somatic dysfunction of spine, lumbar        Somatic dysfunction of abdominal region        Somatic dysfunction of head region          After verbal consent was obtained, patient was treated today with osteopathic manipulative medicine to the regions of the head, lumbar, pelvis, sacrum and abdomen using the techniques of cranial, Still, myofascial release and muscle energy. Areas of compensation relating to her primary pain source also treated. Patient tolerated the procedure well with good objective and good subjective improvement in symptoms. She left the room in good condition. She was advised to stay well hydrated and that she may have some soreness following the procedure. If not improving or worsening, she will call and come in. She will return for reevaluation  in 1-2 weeks if not delivered.   Follow up plan: Return in about 1 week (around 08/31/2015).

## 2015-08-25 ENCOUNTER — Observation Stay
Admission: EM | Admit: 2015-08-25 | Discharge: 2015-08-25 | Disposition: A | Discharge status: Home / Self Care | Payer: Commercial Managed Care - HMO | Source: Home / Self Care | Admitting: Obstetrics and Gynecology

## 2015-08-25 ENCOUNTER — Encounter: Payer: Self-pay | Admitting: *Deleted

## 2015-08-25 DIAGNOSIS — Z887 Allergy status to serum and vaccine status: Secondary | ICD-10-CM

## 2015-08-25 DIAGNOSIS — Z3A39 39 weeks gestation of pregnancy: Secondary | ICD-10-CM | POA: Insufficient documentation

## 2015-08-25 DIAGNOSIS — O99513 Diseases of the respiratory system complicating pregnancy, third trimester: Secondary | ICD-10-CM

## 2015-08-25 DIAGNOSIS — O99113 Other diseases of the blood and blood-forming organs and certain disorders involving the immune mechanism complicating pregnancy, third trimester: Secondary | ICD-10-CM

## 2015-08-25 DIAGNOSIS — Z87891 Personal history of nicotine dependence: Secondary | ICD-10-CM | POA: Insufficient documentation

## 2015-08-25 DIAGNOSIS — E785 Hyperlipidemia, unspecified: Secondary | ICD-10-CM | POA: Insufficient documentation

## 2015-08-25 DIAGNOSIS — O4290 Premature rupture of membranes, unspecified as to length of time between rupture and onset of labor, unspecified weeks of gestation: Secondary | ICD-10-CM | POA: Diagnosis present

## 2015-08-25 DIAGNOSIS — O26893 Other specified pregnancy related conditions, third trimester: Secondary | ICD-10-CM | POA: Insufficient documentation

## 2015-08-25 DIAGNOSIS — O98413 Viral hepatitis complicating pregnancy, third trimester: Secondary | ICD-10-CM | POA: Insufficient documentation

## 2015-08-25 DIAGNOSIS — Z880 Allergy status to penicillin: Secondary | ICD-10-CM | POA: Insufficient documentation

## 2015-08-25 DIAGNOSIS — K219 Gastro-esophageal reflux disease without esophagitis: Secondary | ICD-10-CM

## 2015-08-25 DIAGNOSIS — J45909 Unspecified asthma, uncomplicated: Secondary | ICD-10-CM | POA: Insufficient documentation

## 2015-08-25 DIAGNOSIS — O99613 Diseases of the digestive system complicating pregnancy, third trimester: Secondary | ICD-10-CM

## 2015-08-25 DIAGNOSIS — B1920 Unspecified viral hepatitis C without hepatic coma: Secondary | ICD-10-CM

## 2015-08-25 DIAGNOSIS — O4292 Full-term premature rupture of membranes, unspecified as to length of time between rupture and onset of labor: Secondary | ICD-10-CM | POA: Insufficient documentation

## 2015-08-25 DIAGNOSIS — D696 Thrombocytopenia, unspecified: Secondary | ICD-10-CM | POA: Insufficient documentation

## 2015-08-25 NOTE — Final Progress Note (Signed)
Physician Final Progress Note  Patient ID: Shelley Aguirre MRN: [id number] DOB/AGE: 12/23/1990 24 y.o.  Admit date: 08/25/2015 Admitting provider: Vena Austria, MD Discharge date: 08/25/2015   Admission Diagnoses: Leaking fluid  Discharge Diagnoses:  Active Problems:   Amniotic fluid leaking   Consults: none   Significant Findings/ Diagnostic Studies: none  Procedures: ferning slide  Discharge Condition: good  Disposition: 01-Home or Self Care  Diet: Regular diet  Discharge Activity: Activity as tolerated  Discharge Instructions    Discharge activity:  No Restrictions    Complete by:  As directed      Discharge diet:  No restrictions    Complete by:  As directed      Fetal Kick Count:  Lie on our left side for one hour after a meal, and count the number of times your baby kicks.  If it is less than 5 times, get up, move around and drink some juice.  Repeat the test 30 minutes later.  If it is still less than 5 kicks in an hour, notify your doctor.    Complete by:  As directed      LABOR:  When conractions begin, you should start to time them from the beginning of one contraction to the beginning  of the next.  When contractions are 5 - 10 minutes apart or less and have been regular for at least an hour, you should call your health care provider.    Complete by:  As directed      No sexual activity restrictions    Complete by:  As directed      Notify physician for bleeding from the vagina    Complete by:  As directed      Notify physician for blurring of vision or spots before the eyes    Complete by:  As directed      Notify physician for chills or fever    Complete by:  As directed      Notify physician for fainting spells, "black outs" or loss of consciousness    Complete by:  As directed      Notify physician for increase in vaginal discharge    Complete by:  As directed      Notify physician for leaking of fluid    Complete by:  As directed      Notify  physician for pain or burning when urinating    Complete by:  As directed      Notify physician for pelvic pressure (sudden increase)    Complete by:  As directed      Notify physician for severe or continued nausea or vomiting    Complete by:  As directed      Notify physician for sudden gushing of fluid from the vagina (with or without continued leaking)    Complete by:  As directed      Notify physician for sudden, constant, or occasional abdominal pain    Complete by:  As directed      Notify physician if baby moving less than usual    Complete by:  As directed             Medication List    TAKE these medications        hydrOXYzine 25 MG tablet  Commonly known as:  ATARAX/VISTARIL  Take 25 mg by mouth 3 (three) times daily as needed. One in AM and 2 at night     multivitamin-prenatal 27-0.8 MG Tabs tablet  Take 1 tablet by mouth daily at 12 noon.     omeprazole 20 MG capsule  Commonly known as:  PRILOSEC  Take 1 capsule (20 mg total) by mouth daily.     promethazine 25 MG tablet  Commonly known as:  PHENERGAN  Take 1 tablet (25 mg total) by mouth every 6 (six) hours as needed for nausea or vomiting.         Total time spent taking care of this patient: 20 minutes  Signed: Lynkin Saini M 08/25/2015, 10:00 AM

## 2015-08-25 NOTE — H&P (Signed)
Obstetric H&P   Chief Complaint: Leaking fluid  Prenatal Care Provider: [facility]  History of Present Illness: 24 y.o. G2P0010 5853w4d by 08/28/2015,presenting to L&D with loss of fluid at 0200 and once agin this morning.  Clear.  No contractions, no VB, +FM.  Marland Kitchen.    PNC noteable for HepC, MTHFR mutation, mild thrombocytopenia on 28 week labs.    ABO, Rh: O pos Antibody: negative  Rubella: Immune Varicella: Immune RPR: NR HBsAg:  Negative HIV: Negative 1-hr: 109    Review of Systems: 10 point review of systems negative unless otherwise noted in HPI  Past Medical History: Past Medical History  Diagnosis Date  . Asthma   . GERD (gastroesophageal reflux disease)   . Hyperlipidemia   . Bipolar 2 disorder (HCC)   . Circumvallate placenta   . Hepatitis C     Past Surgical History: Past Surgical History  Procedure Laterality Date  . Tonsillectomy  2007  . Appendectomy      2000  . Wisdom tooth extraction     Family History: Family History  Problem Relation Age of Onset  . Cancer Maternal Grandmother     colon  . Diabetes Maternal Grandfather   . Heart disease Paternal Grandmother   . Crohn's disease Paternal Grandmother   . Autoimmune disease Father   . Crohn's disease Father     Social History: Social History   Social History  . Marital Status: Married    Spouse Name: N/A  . Number of Children: N/A  . Years of Education: N/A   Occupational History  . Not on file.   Social History Main Topics  . Smoking status: Former Smoker    Quit date: 11/13/2014  . Smokeless tobacco: Never Used  . Alcohol Use: No  . Drug Use: No  . Sexual Activity: Yes    Birth Control/ Protection: None   Other Topics Concern  . Not on file   Social History Narrative    Medications: Prior to Admission medications   Medication Sig Start Date End Date Taking? Authorizing Provider  hydrOXYzine (ATARAX/VISTARIL) 25 MG tablet Take 25 mg by mouth 3 (three) times daily as needed. One in  AM and 2 at night   Yes Historical Provider, MD  Prenatal Vit-Fe Fumarate-FA (MULTIVITAMIN-PRENATAL) 27-0.8 MG TABS tablet Take 1 tablet by mouth daily at 12 noon.   Yes Historical Provider, MD  ranitidine (ZANTAC) 150 MG tablet Take 150 mg by mouth daily.   Yes Historical Provider, MD    Allergies: Allergies  Allergen Reactions  . Flu Virus Vaccine Anaphylaxis  . Penicillins     Physical Exam: Filed Vitals:   08/25/15 0908 08/25/15 0911  BP:  122/89  Pulse:  104  Temp:  98.2 F (36.8 C)  TempSrc:  Oral  Resp:  18  Height: 5\' 5"  (1.651 m)   Weight: 107.049 kg (236 lb)      Urine Dip Protein: N/A  FHT: 130, moderate, positive accels, no decels Toco: some irritability  General: NAD HEENT: normocephalic, anicteric sclera Pulmonary: No increased work of breathing Abdomen: Gravid,  Non-tender, negative Murphy's sign Leopolds: breech  Genitourinary: negative pooling, ferning, and nitrazine MSK: no CVA tenderness Extremities: no edema  Labs: No results found for this or any previous visit (from the past 72 hour(s)).  Imaging No results found.  Assessment: 24 y.o. G2P0010 4156w0d by 08/28/2015, presenting with likely gastroenteritis  Plan: 1) SROM - no evidence of rupture on exam routine labor precautions  2)  Fetus - reactive NST, category I tracing  3) Disposition - discharge home

## 2015-08-25 NOTE — Progress Notes (Signed)
NitraTest neg.

## 2015-08-25 NOTE — Progress Notes (Signed)
Discharge instructions reviewed with patient, pt. Verbalized understanding. Signed copy given.

## 2015-08-27 ENCOUNTER — Encounter: Payer: Self-pay | Admitting: *Deleted

## 2015-08-27 ENCOUNTER — Inpatient Hospital Stay
Admission: EM | Admit: 2015-08-27 | Discharge: 2015-08-31 | DRG: 774 | Disposition: A | Discharge status: Home / Self Care | Payer: Commercial Managed Care - HMO | Attending: Obstetrics and Gynecology | Admitting: Obstetrics and Gynecology

## 2015-08-27 DIAGNOSIS — K219 Gastro-esophageal reflux disease without esophagitis: Secondary | ICD-10-CM | POA: Diagnosis present

## 2015-08-27 DIAGNOSIS — Z87891 Personal history of nicotine dependence: Secondary | ICD-10-CM

## 2015-08-27 DIAGNOSIS — O9912 Other diseases of the blood and blood-forming organs and certain disorders involving the immune mechanism complicating childbirth: Secondary | ICD-10-CM | POA: Diagnosis present

## 2015-08-27 DIAGNOSIS — Z22330 Carrier of Group B streptococcus: Secondary | ICD-10-CM

## 2015-08-27 DIAGNOSIS — Z3A40 40 weeks gestation of pregnancy: Secondary | ICD-10-CM

## 2015-08-27 DIAGNOSIS — F3181 Bipolar II disorder: Secondary | ICD-10-CM | POA: Diagnosis present

## 2015-08-27 DIAGNOSIS — O134 Gestational [pregnancy-induced] hypertension without significant proteinuria, complicating childbirth: Secondary | ICD-10-CM | POA: Diagnosis present

## 2015-08-27 DIAGNOSIS — O1493 Unspecified pre-eclampsia, third trimester: Principal | ICD-10-CM | POA: Diagnosis present

## 2015-08-27 DIAGNOSIS — O480 Post-term pregnancy: Secondary | ICD-10-CM | POA: Diagnosis present

## 2015-08-27 DIAGNOSIS — E7212 Methylenetetrahydrofolate reductase deficiency: Secondary | ICD-10-CM | POA: Diagnosis present

## 2015-08-27 DIAGNOSIS — D696 Thrombocytopenia, unspecified: Secondary | ICD-10-CM | POA: Diagnosis present

## 2015-08-27 DIAGNOSIS — O43119 Circumvallate placenta, unspecified trimester: Secondary | ICD-10-CM | POA: Diagnosis present

## 2015-08-27 DIAGNOSIS — O9842 Viral hepatitis complicating childbirth: Secondary | ICD-10-CM | POA: Diagnosis present

## 2015-08-27 DIAGNOSIS — O99284 Endocrine, nutritional and metabolic diseases complicating childbirth: Secondary | ICD-10-CM | POA: Diagnosis present

## 2015-08-27 DIAGNOSIS — Z880 Allergy status to penicillin: Secondary | ICD-10-CM

## 2015-08-27 DIAGNOSIS — E785 Hyperlipidemia, unspecified: Secondary | ICD-10-CM | POA: Diagnosis present

## 2015-08-27 DIAGNOSIS — O99824 Streptococcus B carrier state complicating childbirth: Secondary | ICD-10-CM | POA: Diagnosis present

## 2015-08-27 DIAGNOSIS — J45909 Unspecified asthma, uncomplicated: Secondary | ICD-10-CM | POA: Diagnosis present

## 2015-08-27 DIAGNOSIS — B1920 Unspecified viral hepatitis C without hepatic coma: Secondary | ICD-10-CM | POA: Diagnosis present

## 2015-08-27 DIAGNOSIS — O9952 Diseases of the respiratory system complicating childbirth: Secondary | ICD-10-CM | POA: Diagnosis present

## 2015-08-27 DIAGNOSIS — O1490 Unspecified pre-eclampsia, unspecified trimester: Secondary | ICD-10-CM | POA: Diagnosis present

## 2015-08-27 DIAGNOSIS — O99344 Other mental disorders complicating childbirth: Secondary | ICD-10-CM | POA: Diagnosis present

## 2015-08-28 ENCOUNTER — Inpatient Hospital Stay: Payer: Commercial Managed Care - HMO | Admitting: Anesthesiology

## 2015-08-28 ENCOUNTER — Encounter: Payer: Self-pay | Admitting: Obstetrics and Gynecology

## 2015-08-28 DIAGNOSIS — J45909 Unspecified asthma, uncomplicated: Secondary | ICD-10-CM | POA: Diagnosis present

## 2015-08-28 DIAGNOSIS — O99344 Other mental disorders complicating childbirth: Secondary | ICD-10-CM | POA: Diagnosis present

## 2015-08-28 DIAGNOSIS — O1493 Unspecified pre-eclampsia, third trimester: Secondary | ICD-10-CM | POA: Diagnosis present

## 2015-08-28 DIAGNOSIS — O43119 Circumvallate placenta, unspecified trimester: Secondary | ICD-10-CM | POA: Diagnosis present

## 2015-08-28 DIAGNOSIS — Z87891 Personal history of nicotine dependence: Secondary | ICD-10-CM | POA: Diagnosis not present

## 2015-08-28 DIAGNOSIS — B1920 Unspecified viral hepatitis C without hepatic coma: Secondary | ICD-10-CM | POA: Diagnosis present

## 2015-08-28 DIAGNOSIS — Z3A40 40 weeks gestation of pregnancy: Secondary | ICD-10-CM | POA: Diagnosis not present

## 2015-08-28 DIAGNOSIS — Z880 Allergy status to penicillin: Secondary | ICD-10-CM | POA: Diagnosis not present

## 2015-08-28 DIAGNOSIS — O1490 Unspecified pre-eclampsia, unspecified trimester: Secondary | ICD-10-CM | POA: Diagnosis present

## 2015-08-28 DIAGNOSIS — O480 Post-term pregnancy: Secondary | ICD-10-CM | POA: Diagnosis present

## 2015-08-28 DIAGNOSIS — O9842 Viral hepatitis complicating childbirth: Secondary | ICD-10-CM | POA: Diagnosis present

## 2015-08-28 DIAGNOSIS — D696 Thrombocytopenia, unspecified: Secondary | ICD-10-CM | POA: Diagnosis present

## 2015-08-28 DIAGNOSIS — E7212 Methylenetetrahydrofolate reductase deficiency: Secondary | ICD-10-CM | POA: Diagnosis present

## 2015-08-28 DIAGNOSIS — E785 Hyperlipidemia, unspecified: Secondary | ICD-10-CM | POA: Diagnosis present

## 2015-08-28 DIAGNOSIS — O99284 Endocrine, nutritional and metabolic diseases complicating childbirth: Secondary | ICD-10-CM | POA: Diagnosis present

## 2015-08-28 DIAGNOSIS — O99824 Streptococcus B carrier state complicating childbirth: Secondary | ICD-10-CM | POA: Diagnosis present

## 2015-08-28 DIAGNOSIS — O134 Gestational [pregnancy-induced] hypertension without significant proteinuria, complicating childbirth: Secondary | ICD-10-CM | POA: Diagnosis present

## 2015-08-28 DIAGNOSIS — K219 Gastro-esophageal reflux disease without esophagitis: Secondary | ICD-10-CM | POA: Diagnosis present

## 2015-08-28 DIAGNOSIS — Z22330 Carrier of Group B streptococcus: Secondary | ICD-10-CM | POA: Diagnosis not present

## 2015-08-28 DIAGNOSIS — O9952 Diseases of the respiratory system complicating childbirth: Secondary | ICD-10-CM | POA: Diagnosis present

## 2015-08-28 DIAGNOSIS — O9912 Other diseases of the blood and blood-forming organs and certain disorders involving the immune mechanism complicating childbirth: Secondary | ICD-10-CM | POA: Diagnosis present

## 2015-08-28 DIAGNOSIS — F3181 Bipolar II disorder: Secondary | ICD-10-CM | POA: Diagnosis present

## 2015-08-28 LAB — CBC
HEMATOCRIT: 35 % (ref 35.0–47.0)
Hemoglobin: 11.8 g/dL — ABNORMAL LOW (ref 12.0–16.0)
MCH: 27.3 pg (ref 26.0–34.0)
MCHC: 33.7 g/dL (ref 32.0–36.0)
MCV: 81 fL (ref 80.0–100.0)
Platelets: 166 10*3/uL (ref 150–440)
RBC: 4.33 MIL/uL (ref 3.80–5.20)
RDW: 14.1 % (ref 11.5–14.5)
WBC: 13.3 10*3/uL — ABNORMAL HIGH (ref 3.6–11.0)

## 2015-08-28 LAB — RAPID HIV SCREEN (HIV 1/2 AB+AG)
HIV 1/2 Antibodies: NONREACTIVE
HIV-1 P24 ANTIGEN - HIV24: NONREACTIVE

## 2015-08-28 LAB — COMPREHENSIVE METABOLIC PANEL
ALBUMIN: 2.7 g/dL — AB (ref 3.5–5.0)
ALK PHOS: 341 U/L — AB (ref 38–126)
ALT: 16 U/L (ref 14–54)
AST: 19 U/L (ref 15–41)
Anion gap: 8 (ref 5–15)
BILIRUBIN TOTAL: 0.4 mg/dL (ref 0.3–1.2)
BUN: 7 mg/dL (ref 6–20)
CO2: 18 mmol/L — ABNORMAL LOW (ref 22–32)
Calcium: 8.3 mg/dL — ABNORMAL LOW (ref 8.9–10.3)
Chloride: 108 mmol/L (ref 101–111)
Creatinine, Ser: 0.41 mg/dL — ABNORMAL LOW (ref 0.44–1.00)
GFR calc Af Amer: >60 mL/min (ref >60)
GFR calc non Af Amer: >60 mL/min (ref >60)
GLUCOSE: 93 mg/dL (ref 65–99)
POTASSIUM: 3.7 mmol/L (ref 3.5–5.1)
Sodium: 134 mmol/L — ABNORMAL LOW (ref 135–145)
TOTAL PROTEIN: 6.3 g/dL — AB (ref 6.5–8.1)

## 2015-08-28 LAB — TYPE AND SCREEN
ABO/RH(D): O POS
Antibody Screen: NEGATIVE

## 2015-08-28 LAB — CHLAMYDIA/NGC RT PCR (ARMC ONLY)
Chlamydia Tr: NOT DETECTED
N gonorrhoeae: NOT DETECTED

## 2015-08-28 LAB — URINE DRUG SCREEN, QUALITATIVE (ARMC ONLY)
AMPHETAMINES, UR SCREEN: NOT DETECTED
BARBITURATES, UR SCREEN: NOT DETECTED
BENZODIAZEPINE, UR SCRN: NOT DETECTED
Cannabinoid 50 Ng, Ur ~~LOC~~: NOT DETECTED
Cocaine Metabolite,Ur ~~LOC~~: NOT DETECTED
MDMA (Ecstasy)Ur Screen: NOT DETECTED
Methadone Scn, Ur: NOT DETECTED
OPIATE, UR SCREEN: NOT DETECTED
Phencyclidine (PCP) Ur S: NOT DETECTED
Tricyclic, Ur Screen: NOT DETECTED

## 2015-08-28 LAB — PROTEIN / CREATININE RATIO, URINE
Creatinine, Urine: 173 mg/dL
Protein Creatinine Ratio: 0.32 mg/mg{Cre} — ABNORMAL HIGH (ref 0.00–0.15)
Total Protein, Urine: 56 mg/dL

## 2015-08-28 LAB — PROTIME-INR
INR: 1.06
Prothrombin Time: 14 seconds (ref 11.4–15.0)

## 2015-08-28 LAB — ABO/RH: ABO/RH(D): O POS

## 2015-08-28 LAB — APTT: aPTT: 33 seconds (ref 24–36)

## 2015-08-28 MED ORDER — OXYTOCIN 40 UNITS IN LACTATED RINGERS INFUSION - SIMPLE MED
62.5000 mL/h | INTRAVENOUS | Status: DC
  Filled 2015-08-28: qty 1000

## 2015-08-28 MED ORDER — TERBUTALINE SULFATE 1 MG/ML IJ SOLN: 0.2500 mg | Freq: Once | INTRAMUSCULAR | Status: DC | PRN

## 2015-08-28 MED ORDER — NALBUPHINE HCL 10 MG/ML IJ SOLN
5.0000 mg | Freq: Once | INTRAMUSCULAR | Status: DC | PRN
  Filled 2015-08-28: qty 0.5

## 2015-08-28 MED ORDER — FENTANYL 2.5 MCG/ML W/ROPIVACAINE 0.2% IN NS 100 ML EPIDURAL INFUSION (ARMC-ANES)
10.0000 mL/h | EPIDURAL | Status: DC
  Administered 2015-08-28: 10 mL/h via EPIDURAL
  Filled 2015-08-28: qty 100

## 2015-08-28 MED ORDER — ONDANSETRON HCL 4 MG/2ML IJ SOLN
4.0000 mg | Freq: Four times a day (QID) | INTRAMUSCULAR | Status: DC | PRN
  Administered 2015-08-28: 4 mg via INTRAVENOUS
  Filled 2015-08-28: qty 2

## 2015-08-28 MED ORDER — DIPHENHYDRAMINE HCL 50 MG/ML IJ SOLN: 12.5000 mg | INTRAMUSCULAR | Status: DC | PRN

## 2015-08-28 MED ORDER — NALBUPHINE HCL 10 MG/ML IJ SOLN: 5.0000 mg | Freq: Once | INTRAMUSCULAR | Status: DC | PRN

## 2015-08-28 MED ORDER — OXYTOCIN 10 UNIT/ML IJ SOLN
INTRAMUSCULAR | Status: AC
  Filled 2015-08-28: qty 2

## 2015-08-28 MED ORDER — SODIUM CHLORIDE 0.9 % IJ SOLN: 3.0000 mL | INTRAMUSCULAR | Status: DC | PRN

## 2015-08-28 MED ORDER — NALBUPHINE HCL 10 MG/ML IJ SOLN
5.0000 mg | INTRAMUSCULAR | Status: DC | PRN
  Filled 2015-08-28: qty 0.5

## 2015-08-28 MED ORDER — CLINDAMYCIN PHOSPHATE 900 MG/50ML IV SOLN
INTRAVENOUS | Status: AC
  Administered 2015-08-28: 900 mg via INTRAVENOUS
  Filled 2015-08-28: qty 50

## 2015-08-28 MED ORDER — FENTANYL 2.5 MCG/ML W/ROPIVACAINE 0.2% IN NS 100 ML EPIDURAL INFUSION (ARMC-ANES)
EPIDURAL | Status: AC
  Administered 2015-08-28: 10 mL/h via EPIDURAL
  Filled 2015-08-28: qty 100

## 2015-08-28 MED ORDER — LIDOCAINE HCL (PF) 1 % IJ SOLN
INTRAMUSCULAR | Status: AC
  Filled 2015-08-28: qty 30

## 2015-08-28 MED ORDER — OXYTOCIN 10 UNIT/ML IJ SOLN: 10.0000 [IU] | Freq: Once | INTRAMUSCULAR | Status: DC

## 2015-08-28 MED ORDER — ONDANSETRON HCL 4 MG/2ML IJ SOLN
4.0000 mg | Freq: Three times a day (TID) | INTRAMUSCULAR | Status: DC | PRN
  Administered 2015-08-28: 4 mg via INTRAVENOUS
  Filled 2015-08-28: qty 2

## 2015-08-28 MED ORDER — OXYTOCIN 40 UNITS IN LACTATED RINGERS INFUSION - SIMPLE MED
1.0000 m[IU]/min | INTRAVENOUS | Status: DC
  Administered 2015-08-28: 1 m[IU]/min via INTRAVENOUS

## 2015-08-28 MED ORDER — MISOPROSTOL 200 MCG PO TABS
ORAL_TABLET | ORAL | Status: AC
  Filled 2015-08-28: qty 4

## 2015-08-28 MED ORDER — LACTATED RINGERS IV SOLN: 500.0000 mL | INTRAVENOUS | Status: DC | PRN

## 2015-08-28 MED ORDER — NALOXONE HCL 0.4 MG/ML IJ SOLN: 0.4000 mg | INTRAMUSCULAR | Status: DC | PRN

## 2015-08-28 MED ORDER — LACTATED RINGERS IV SOLN
INTRAVENOUS | Status: DC
  Administered 2015-08-28 (×2): via INTRAVENOUS
  Administered 2015-08-28: 125 mL/h via INTRAVENOUS
  Administered 2015-08-28: 11:00:00 via INTRAVENOUS

## 2015-08-28 MED ORDER — STERILE WATER FOR INJECTION IJ SOLN
INTRAMUSCULAR | Status: AC
  Filled 2015-08-28: qty 10

## 2015-08-28 MED ORDER — CLINDAMYCIN PHOSPHATE 900 MG/50ML IV SOLN
900.0000 mg | Freq: Three times a day (TID) | INTRAVENOUS | Status: DC
  Administered 2015-08-28 (×3): 900 mg via INTRAVENOUS
  Filled 2015-08-28 (×2): qty 50

## 2015-08-28 MED ORDER — ACETAMINOPHEN 500 MG PO TABS: 1000.0000 mg | ORAL_TABLET | Freq: Four times a day (QID) | ORAL | Status: DC | PRN

## 2015-08-28 MED ORDER — LIDOCAINE-EPINEPHRINE (PF) 1.5 %-1:200000 IJ SOLN
INTRAMUSCULAR | Status: DC | PRN
  Administered 2015-08-28: 3 mL via PERINEURAL

## 2015-08-28 MED ORDER — AMMONIA AROMATIC IN INHA
RESPIRATORY_TRACT | Status: AC
  Filled 2015-08-28: qty 10

## 2015-08-28 MED ORDER — OXYTOCIN BOLUS FROM INFUSION
500.0000 mL | INTRAVENOUS | Status: DC
  Administered 2015-08-29: 500 mL via INTRAVENOUS

## 2015-08-28 MED ORDER — MEPERIDINE HCL 25 MG/ML IJ SOLN: 6.2500 mg | INTRAMUSCULAR | Status: DC | PRN

## 2015-08-28 MED ORDER — NALBUPHINE HCL 10 MG/ML IJ SOLN: 5.0000 mg | INTRAMUSCULAR | Status: DC | PRN

## 2015-08-28 MED ORDER — KETOROLAC TROMETHAMINE 30 MG/ML IJ SOLN: 30.0000 mg | Freq: Four times a day (QID) | INTRAMUSCULAR | Status: AC | PRN

## 2015-08-28 MED ORDER — DINOPROSTONE 10 MG VA INST
10.0000 mg | VAGINAL_INSERT | Freq: Once | VAGINAL | Status: AC
  Administered 2015-08-28: 10 mg via VAGINAL
  Filled 2015-08-28: qty 1

## 2015-08-28 MED ORDER — SODIUM CHLORIDE 0.9 % IV SOLN
INTRAVENOUS | Status: DC | PRN
  Administered 2015-08-28 (×2): 5 mL via EPIDURAL

## 2015-08-28 MED ORDER — OXYTOCIN 40 UNITS IN LACTATED RINGERS INFUSION - SIMPLE MED: 1.0000 m[IU]/min | INTRAVENOUS | Status: DC

## 2015-08-28 MED ORDER — LIDOCAINE HCL (PF) 1 % IJ SOLN
30.0000 mL | INTRAMUSCULAR | Status: DC | PRN
  Administered 2015-08-29: 30 mL via SUBCUTANEOUS
  Filled 2015-08-28: qty 30

## 2015-08-28 MED ORDER — DIPHENHYDRAMINE HCL 25 MG PO CAPS: 25.0000 mg | ORAL_CAPSULE | ORAL | Status: DC | PRN

## 2015-08-28 MED ORDER — KETOROLAC TROMETHAMINE 30 MG/ML IJ SOLN: 30.0000 mg | Freq: Four times a day (QID) | INTRAMUSCULAR | Status: DC | PRN

## 2015-08-28 MED ORDER — BUTORPHANOL TARTRATE 1 MG/ML IJ SOLN
1.0000 mg | INTRAMUSCULAR | Status: DC | PRN
  Administered 2015-08-28 (×2): 1 mg via INTRAVENOUS
  Filled 2015-08-28 (×2): qty 1

## 2015-08-28 MED ORDER — NALOXONE HCL 1 MG/ML IJ SOLN
1.0000 ug/kg/h | INTRAVENOUS | Status: DC | PRN
  Filled 2015-08-28: qty 2

## 2015-08-28 MED ORDER — CITRIC ACID-SODIUM CITRATE 334-500 MG/5ML PO SOLN: 30.0000 mL | ORAL | Status: DC | PRN

## 2015-08-28 NOTE — H&P (Addendum)
Obstetric H&P   Chief Complaint: Leaking fluid  Prenatal Care Provider: [facility]  History of Present Illness: 24 y.o. G2P0010 46w0dby 08/28/2015,presenting to L&D with cramping and small leakage of fluid, clear.  No contractions, no VB, +FM.  .Marland Kitchen   PNC noteable for HepC, MTHFR mutation, mild thrombocytopenia on 28 week labs.    ABO, Rh: O pos Antibody: negative  Rubella: Immune Varicella: Immune RPR: NR HBsAg:  Negative HIV: Negative 1-hr: 109 GBS positive, sensitive to clindamycin    Review of Systems: 10 point review of systems negative unless otherwise noted in HPI  Past Medical History  Diagnosis Date  . Asthma   . GERD (gastroesophageal reflux disease)   . Hyperlipidemia   . Bipolar 2 disorder ([place]   . Circumvallate placenta   . Hepatitis C    Past Surgical History  Procedure Laterality Date  . Tonsillectomy  2007  . Appendectomy      2000  . Wisdom tooth extraction     Family History  Problem Relation Age of Onset  . Cancer Maternal Grandmother     colon  . Diabetes Maternal Grandfather   . Heart disease Paternal Grandmother   . Crohn's disease Paternal Grandmother   . Autoimmune disease Father   . Crohn's disease Father    Social History   Social History  . Marital Status: Married    Spouse Name: N/A  . Number of Children: N/A  . Years of Education: N/A   Occupational History  . Not on file.   Social History Main Topics  . Smoking status: Former Smoker    Quit date: 11/13/2014  . Smokeless tobacco: Never Used  . Alcohol Use: No  . Drug Use: No  . Sexual Activity: Yes    Birth Control/ Protection: None   Other Topics Concern  . Not on file   Social History Narrative    Medications: Prior to Admission medications   Medication Sig Start Date End Date Taking? Authorizing Provider  hydrOXYzine (ATARAX/VISTARIL) 25 MG tablet Take 25 mg by mouth 3 (three) times daily as needed. One in AM and 2 at night   Yes Historical Provider, MD  Prenatal  Vit-Fe Fumarate-FA (MULTIVITAMIN-PRENATAL) 27-0.8 MG TABS tablet Take 1 tablet by mouth daily at 12 noon.   Yes Historical Provider, MD  ranitidine (ZANTAC) 150 MG tablet Take 150 mg by mouth daily.   Yes Historical Provider, MD   Allergies  Allergen Reactions  . Flu Virus Vaccine Anaphylaxis  . Penicillins     Physical Exam: Filed Vitals:   08/28/15 0101 08/28/15 0116 08/28/15 0131 08/28/15 0146  BP: 117/90 133/97 134/103 128/91  Pulse: 100 89 101 98  Temp:      TempSrc:      Resp:      Height:    5' 5"  (1.651 m)  Weight:    236 lb (107.049 kg)   FHT: 135, moderate variability, positive accels, no decels Toco: occasional contractions  General: NAD HEENT: normocephalic, anicteric sclera Pulmonary: No increased work of breathing Abdomen: Gravid,  Non-tender, negative Murphy's sign Leopolds: 7.5 pounds Genitourinary: negative nitrazine,  SVE: 1 cm per RN MSK: no CVA tenderness Extremities: no edema  Labs: Results for orders placed or performed during the hospital encounter of 08/27/15 (from the past 72 hour(s))  Protein / creatinine ratio, urine     Status: Abnormal   Collection Time: 08/28/15 12:59 AM  Result Value Ref Range   Creatinine, Urine 173 mg/dL  Total Protein, Urine 56 mg/dL    Comment: NO NORMAL RANGE ESTABLISHED FOR THIS TEST   Protein Creatinine Ratio 0.32 (H) 0.00 - 0.15 mg/mg[Cre]  Comprehensive metabolic panel     Status: Abnormal   Collection Time: 08/28/15  1:09 AM  Result Value Ref Range   Sodium 134 (L) 135 - 145 mmol/L   Potassium 3.7 3.5 - 5.1 mmol/L   Chloride 108 101 - 111 mmol/L   CO2 18 (L) 22 - 32 mmol/L   Glucose, Bld 93 65 - 99 mg/dL   BUN 7 6 - 20 mg/dL   Creatinine, Ser 0.41 (L) 0.44 - 1.00 mg/dL   Calcium 8.3 (L) 8.9 - 10.3 mg/dL   Total Protein 6.3 (L) 6.5 - 8.1 g/dL   Albumin 2.7 (L) 3.5 - 5.0 g/dL   AST 19 15 - 41 U/L   ALT 16 14 - 54 U/L   Alkaline Phosphatase 341 (H) 38 - 126 U/L   Total Bilirubin 0.4 0.3 - 1.2 mg/dL    GFR calc non Af Amer >60 >60 mL/min   GFR calc Af Amer >60 >60 mL/min    Comment: (NOTE) The eGFR has been calculated using the CKD EPI equation. This calculation has not been validated in all clinical situations. eGFR's persistently <60 mL/min signify possible Chronic Kidney Disease.    Anion gap 8 5 - 15  CBC     Status: Abnormal   Collection Time: 08/28/15  1:09 AM  Result Value Ref Range   WBC 13.3 (H) 3.6 - 11.0 K/uL   RBC 4.33 3.80 - 5.20 MIL/uL   Hemoglobin 11.8 (L) 12.0 - 16.0 g/dL   HCT 35.0 35.0 - 47.0 %   MCV 81.0 80.0 - 100.0 fL   MCH 27.3 26.0 - 34.0 pg   MCHC 33.7 32.0 - 36.0 g/dL   RDW 14.1 11.5 - 14.5 %   Platelets 166 150 - 440 K/uL    Bedside ultrasound: cephalic  Assessment: 24 y.o. G2P0010 20w0dwith preeclampsia without severe features Patient Active Problem List   Diagnosis Date Noted  . Labor and delivery, indication for care 08/28/2015  . Hepatitis C   . Asthma   . Circumvallate placenta   . Pregnancy 04/16/2015  . Bipolar 2 disorder ([place] 04/16/2015   Plan: 1) preeclampsia without severe features: Will admit for induction of labor given her gestational age.  No evidence of severe features by symptoms or labs.    2) Fetus - reactive NST, category I tracing  3) GBS positive - clindamycin  4) Hepatitis C: viral load drawn, LFTs wnl, PT/INR drawn, also.  JWill Bonnet MD 08/28/2015 2:19 AM

## 2015-08-28 NOTE — Progress Notes (Signed)
Intrapartum progress note  S: doing well s/p epidural.  No HA SOB change vision or RUQ/epigastric pain O:  BP 129/80 mmHg  Pulse 93  Temp(Src) 97.9 F (36.6 C) (Oral)  Resp 16  100% RA No severe range pressures since admission  SVE: 4/85/-1 - IUPC placed without difficulty FHT: 140 mod + accels + variable TOCO: doublets q1-134min  A/P: 24yo G2P0010 @ 40.0 with IOL for preeclampsia without severe features 1. IUP: category 2 with one variable 2. Continuous toco/efm 3. measure montevideo units to assess adequacy of contractions.  Adjust pitocin accordingly.   ----- Shelley Plumberhelsea Augie Vane, MD Attending Obstetrician and Gynecologist [facility]

## 2015-08-28 NOTE — Anesthesia Procedure Notes (Signed)
Epidural Patient location during procedure: OB Start time: 08/28/2015 2:35 PM End time: 08/28/2015 2:55 PM  Staffing Anesthesiologist: Lenard SimmerKARENZ, Anjalina Bergevin Performed by: anesthesiologist   Preanesthetic Checklist Completed: patient identified, site marked, surgical consent, pre-op evaluation, timeout performed, IV checked, risks and benefits discussed and monitors and equipment checked  Epidural Patient position: sitting Prep: ChloraPrep Patient monitoring: heart rate, cardiac monitor, continuous pulse ox and blood pressure Approach: midline Location: L3-L4 Injection technique: LOR saline  Needle:  Needle type: Tuohy  Needle gauge: 18 G Needle length: 9 cm Needle insertion depth: 8 cm Catheter type: closed end flexible Catheter size: 20 Guage Catheter at skin depth: 12 cm Test dose: negative and 1.5% lidocaine with Epi 1:200 K  Additional Notes Reason for block:procedure for pain

## 2015-08-28 NOTE — Progress Notes (Signed)
Pt reports nausea, agreeable to antiemetic. [facility], aware of pt's request for Tylenol

## 2015-08-28 NOTE — Progress Notes (Signed)
12:43 pm:  Pitocin increased to 417mu/min. Charted under wrong order. Corrected. Unable to get first documented note removed. Showing rate as 3614mu/min which it is incorrect and not running at this rate.  Ellison Carwin Jolynne Spurgin RNC

## 2015-08-28 NOTE — Progress Notes (Signed)
Intrapartum progress note  Cervidil removed by patient this morning after overnight treatment for cervical ripening.  She had PROM at 6am, and is comfortable, not really feeling contractions.  No fever,chills, cp sob or LE pain.  No HA change vision or RUQ pain.  O: 98.4 F, 94, 16, 137/89 (137-155/89-105), 100% RA  SVE: 3/80/-1 (tight) FHT: 135 mod +accels no decels TOCO: irregular   A/P: 24yo G2P0010 @ 40.0 with IOL for preeclampsia without severe features  1. IUP: category 1 tracing 2. Continuous toco/efm 3. Hep C: no operative delivery 4. GBS+ clinda sensitive, continue q8hrs until delivery 5. GHTN: no s/sx worsening preeclampsia, no severe features 6. IOL: add pitocin for continued induction of labor.    ----- Chelsea Ward, MD Attending ObstetRanae Plumberrician and Gynecologist [facility]

## 2015-08-28 NOTE — Anesthesia Preprocedure Evaluation (Addendum)
Anesthesia Evaluation  Patient identified by MRN, date of birth, ID band Patient awake    Reviewed: Allergy & Precautions, H&P , NPO status , Patient's Chart, lab work & pertinent test results, reviewed documented beta blocker date and time   History of Anesthesia Complications Negative for: history of anesthetic complications  Airway Mallampati: II  TM Distance: >3 FB Neck ROM: full    Dental no notable dental hx.    Pulmonary neg shortness of breath, asthma , neg sleep apnea, neg COPD, neg recent URI, former smoker,    Pulmonary exam normal breath sounds clear to auscultation       Cardiovascular Exercise Tolerance: Good hypertension (gestational), (-) angina(-) CAD, (-) Past MI, (-) Cardiac Stents and (-) CABG Normal cardiovascular exam(-) dysrhythmias  Rhythm:regular Rate:Normal     Neuro/Psych negative neurological ROS  negative psych ROS   GI/Hepatic GERD  ,(+) Hepatitis -, C  Endo/Other  neg diabetesMorbid obesity  Renal/GU negative Renal ROS  negative genitourinary   Musculoskeletal   Abdominal   Peds  Hematology negative hematology ROS (+)   Anesthesia Other Findings Past Medical History:   Asthma                                                       GERD (gastroesophageal reflux disease)                       Hyperlipidemia                                               Bipolar 2 disorder (H[facility])                                     Circumvallate placenta                                       Hepatitis C                                                  Reproductive/Obstetrics negative OB ROS                            Anesthesia Physical Anesthesia Plan  ASA: III  Anesthesia Plan: Epidural   Post-op Pain Management:    Induction:   Airway Management Planned:   Additional Equipment:   Intra-op Plan:   Post-operative Plan:   Informed Consent: I have reviewed the  patients History and Physical, chart, labs and discussed the procedure including the risks, benefits and alternatives for the proposed anesthesia with the patient or authorized representative who has indicated his/her understanding and acceptance.   Dental Advisory Given  Plan Discussed with: Anesthesiologist, CRNA and Surgeon  Anesthesia Plan Comments:         Anesthesia Quick Evaluation

## 2015-08-29 LAB — RPR: RPR: NONREACTIVE

## 2015-08-29 LAB — CBC
HEMATOCRIT: 31.1 % — AB (ref 35.0–47.0)
Hemoglobin: 10.6 g/dL — ABNORMAL LOW (ref 12.0–16.0)
MCH: 27.6 pg (ref 26.0–34.0)
MCHC: 34.1 g/dL (ref 32.0–36.0)
MCV: 80.9 fL (ref 80.0–100.0)
PLATELETS: 140 10*3/uL — AB (ref 150–440)
RBC: 3.84 MIL/uL (ref 3.80–5.20)
RDW: 14.2 % (ref 11.5–14.5)
WBC: 20.2 10*3/uL — ABNORMAL HIGH (ref 3.6–11.0)

## 2015-08-29 LAB — HCV RNA QUANT
HCV QUANT LOG: 5.029 {Log_IU}/mL (ref >1.70)
HCV Quantitative: 107000 IU/mL (ref >50)

## 2015-08-29 MED ORDER — WITCH HAZEL-GLYCERIN EX PADS: 1.0000 "application " | MEDICATED_PAD | CUTANEOUS | Status: DC | PRN

## 2015-08-29 MED ORDER — OXYCODONE-ACETAMINOPHEN 5-325 MG PO TABS
1.0000 | ORAL_TABLET | ORAL | Status: DC | PRN
  Administered 2015-08-29 – 2015-08-31 (×11): 1 via ORAL
  Filled 2015-08-29 (×13): qty 1

## 2015-08-29 MED ORDER — IBUPROFEN 600 MG PO TABS
ORAL_TABLET | ORAL | Status: AC
  Administered 2015-08-29: 600 mg
  Filled 2015-08-29: qty 1

## 2015-08-29 MED ORDER — ONDANSETRON HCL 4 MG PO TABS
4.0000 mg | ORAL_TABLET | ORAL | Status: DC | PRN
  Administered 2015-08-29: 4 mg via ORAL
  Filled 2015-08-29: qty 1

## 2015-08-29 MED ORDER — ACETAMINOPHEN 325 MG PO TABS: 650.0000 mg | ORAL_TABLET | ORAL | Status: DC | PRN

## 2015-08-29 MED ORDER — ONDANSETRON HCL 4 MG/2ML IJ SOLN: 4.0000 mg | INTRAMUSCULAR | Status: DC | PRN

## 2015-08-29 MED ORDER — DIBUCAINE 1 % RE OINT: 1.0000 "application " | TOPICAL_OINTMENT | RECTAL | Status: DC | PRN

## 2015-08-29 MED ORDER — LANOLIN HYDROUS EX OINT: TOPICAL_OINTMENT | CUTANEOUS | Status: DC | PRN

## 2015-08-29 MED ORDER — DIPHENHYDRAMINE HCL 25 MG PO CAPS: 25.0000 mg | ORAL_CAPSULE | Freq: Four times a day (QID) | ORAL | Status: DC | PRN

## 2015-08-29 MED ORDER — OXYTOCIN 40 UNITS IN LACTATED RINGERS INFUSION - SIMPLE MED
INTRAVENOUS | Status: AC
  Filled 2015-08-29: qty 1000

## 2015-08-29 MED ORDER — OXYCODONE-ACETAMINOPHEN 5-325 MG PO TABS
ORAL_TABLET | ORAL | Status: AC
  Administered 2015-08-29: 1
  Filled 2015-08-29: qty 1

## 2015-08-29 MED ORDER — OXYTOCIN 40 UNITS IN LACTATED RINGERS INFUSION - SIMPLE MED: 62.5000 mL/h | INTRAVENOUS | Status: DC | PRN

## 2015-08-29 MED ORDER — IBUPROFEN 600 MG PO TABS
600.0000 mg | ORAL_TABLET | Freq: Four times a day (QID) | ORAL | Status: DC
  Administered 2015-08-29 – 2015-08-31 (×8): 600 mg via ORAL
  Filled 2015-08-29 (×9): qty 1

## 2015-08-29 MED ORDER — PRENATAL MULTIVITAMIN CH
1.0000 | ORAL_TABLET | Freq: Every day | ORAL | Status: DC
  Administered 2015-08-29 – 2015-08-30 (×2): 1 via ORAL
  Filled 2015-08-29 (×2): qty 1

## 2015-08-29 MED ORDER — DOCUSATE SODIUM 100 MG PO CAPS
100.0000 mg | ORAL_CAPSULE | Freq: Two times a day (BID) | ORAL | Status: DC
  Administered 2015-08-29 – 2015-08-30 (×3): 100 mg via ORAL
  Filled 2015-08-29 (×3): qty 1

## 2015-08-29 MED ORDER — SIMETHICONE 80 MG PO CHEW
80.0000 mg | CHEWABLE_TABLET | ORAL | Status: DC | PRN
  Administered 2015-08-29 – 2015-08-30 (×2): 80 mg via ORAL
  Filled 2015-08-29 (×2): qty 1

## 2015-08-29 NOTE — Plan of Care (Signed)
Problem: Phase I Progression Outcomes Goal: OOB as tolerated unless otherwise ordered Outcome: Progressing Up with assist at this time.

## 2015-08-29 NOTE — Progress Notes (Signed)
Pt reports increased pain LLQ and L lower side of abd. States side present inbetween u/c's. Uterus soft between u/c's no bloody show noted, AF clear. Pillow placement provided to assist lifting weight of uterus from bed.

## 2015-08-29 NOTE — Discharge Summary (Signed)
Obstetrical Discharge Summary  Date of Admission: 08/27/2015 Date of Discharge: 08/31/2015  Primary OB: [facility]  Gestational Age at Delivery: 5037w1d   Antepartum complications: Hep C+, history of depression/anxiety, thrombocytopenia, MTHFR mutation, influenza and PCN allergy, GBS+ Reason for Admission: IOL for preeclampsia without severe features Date of Delivery: 08/29/15 Delivered By: Leeroy Bockhelsea Ward Delivery Type: spontaneous vaginal delivery Intrapartum complications/course: IOL with cervidil then pitocin. IUPC placed for measurement of adequacy of contractions. No complications.  Anesthesia: epidural Placenta: sponatneous Laceration: 2nd degree Episiotomy: none Newborn Data: Live born female  Birth Weight:   APGAR: 9, 9    Discharge Physical Exam:  BP 127/79 mmHg  Pulse 75  Temp(Src) 97.7 F (36.5 C) (Oral)  Resp 18  Ht 5\' 5"  (1.651 m)  Wt 107.049 kg (236 lb)  BMI 39.27 kg/m2  SpO2 98%  LMP 11/20/2014  General: NAD CV: RRR Pulm: CTABL, nl effort ABD: s/nd/nt, fundus firm and below the umbilicus Lochia: moderate DVT Evaluation: LE non-ttp, no evidence of DVT on exam.  HEMOGLOBIN  Date Value Ref Range Status  08/29/2015 10.6* 12.0 - 16.0 g/dL Final   HGB  Date Value Ref Range Status  09/16/2014 14.5 12.0-16.0 g/dL Final   HCT  Date Value Ref Range Status  08/29/2015 31.1* 35.0 - 47.0 % Final  09/16/2014 42.8 35.0-47.0 % Final    Post partum course: Uncomplicated postpartum course Postpartum Procedures: none Disposition: stable, discharge to home.  Rh Immune globulin given: no Rubella vaccine given: no Tdap vaccine given in AP or PP setting: antepartum Flu vaccine given in AP or PP setting: allergic  Contraception: IUD vs Nexplanon TBD  Prenatal Labs:  ABO, Rh: O pos Antibody: negative  Rubella: Immune Varicella: Immune RPR: NR HBsAg: Negative HIV: Negative 1-hr: 109 GBS positive, sensitive to clindamycin   Plan:  Shelley Aguirre was  discharged to home in good condition.  She will follow up in 3 days for BP check. [facility] Dr Elesa MassedWard in 6 weeks   Discharge Medications:   Medication List    STOP taking these medications        hydrOXYzine 25 MG tablet  Commonly known as:  ATARAX/VISTARIL     promethazine 25 MG tablet  Commonly known as:  PHENERGAN      TAKE these medications        ibuprofen 600 MG tablet  Commonly known as:  ADVIL,MOTRIN  Take 1 tablet (600 mg total) by mouth every 6 (six) hours.     multivitamin-prenatal 27-0.8 MG Tabs tablet  Take 1 tablet by mouth daily at 12 noon.     omeprazole 20 MG capsule  Commonly known as:  PRILOSEC  Take 1 capsule (20 mg total) by mouth daily.        Signed:  Vena AustriaAndreas Elkin Belfield, MD

## 2015-08-29 NOTE — Anesthesia Postprocedure Evaluation (Signed)
  Anesthesia Post-op Note  Patient: Shelley Aguirre  Procedure(s) Performed: * No procedures listed *  Anesthesia type:Epidural  Patient location: PACU  Post pain: Pain level controlled  Post assessment: Post-op Vital signs reviewed, Patient's Cardiovascular Status Stable, Respiratory Function Stable, Patent Airway and No signs of Nausea or vomiting  Post vital signs: Reviewed and stable  Last Vitals:  Filed Vitals:   08/29/15 1231  BP: 126/80  Pulse: 66  Temp: 36.7 C  Resp: 18    Level of consciousness: awake, alert  and patient cooperative  Complications: No apparent anesthesia complications

## 2015-08-29 NOTE — Progress Notes (Signed)
Pt states that seh feels ++ pressure/pain LLQ of abd with u/c's. Levels checked with alcohol pad. R level noted to be at level of umbilicus, L side level at L groin. Dr Karlton LemonKarenz notified. Dr Dione Housekeeperstaed to turn pt L side down and increase Epidural infusion pump to 20 cc/hx 1 hour. Pt and spouse notified.

## 2015-08-30 NOTE — Progress Notes (Signed)
Post Partum Day 1. Preeclampsia without severe features Subjective: Doing well. Bleeding slowing. Tolerating regular diet. Baby under bili lights  Objective: Blood pressure 137/87, pulse 82, temperature 98 F (36.7 C), temperature source Oral, resp. rate 18, height 5\' 5"  (1.651 m), weight 236 lb (107.049 kg), last menstrual period 11/20/2014, SpO2 99 %.  Physical Exam:  General: alert and no distress Lochia: appropriate Uterine Fundus: firm/ U/ML/NT Perineal incision not visualized DVT Evaluation: No evidence of DVT seen on physical exam.   Recent Labs  08/28/15 0109 08/29/15 0456  HGB 11.8* 10.6*  HCT 35.0 31.1*  WBC 13.3* 20.2*  PLT 166 140*    Assessment/Plan: PPD #1 stable. Normotensive so far postpartum Continue blood pressures q4hr Plan for discharge tomorrow TDAP UTD O POS/RI/VI/ Breast  LOS: 2 days   Shelley Aguirre 08/30/2015, 5:38 PM

## 2015-08-30 NOTE — Care Management Important Message (Signed)
Important Message  Patient Details  Name: Shelley Aguirre MRN: [id number] Date of Birth: 02/28/1991   Medicare Important Message Given:  Yes-second notification given    Gwenette GreetBrenda S [employer], RN 08/30/2015, 8:10 AM

## 2015-08-31 MED ORDER — IBUPROFEN 600 MG PO TABS: 600.0000 mg | ORAL_TABLET | Freq: Four times a day (QID) | ORAL | Status: DC

## 2015-08-31 NOTE — Discharge Instructions (Signed)
Discharge instructions:   Call office if you have any of the following: headache, visual changes, fever >100.4 F, chills, breast concerns, excessive vaginal bleeding, leg pain or redness, depression or any other concerns.   Call your doctor for increased pain or vaginal bleeding, temperature above 100.4, depression, or concerns.  No strenuous activity or heavy lifting for 6 weeks.  No intercourse, tampons, douching, or enemas for 6 weeks.  No tub baths-showers only.  No driving for 2 weeks or while taking pain medications.  Continue prenatal vitamin and iron.  Increase calories and fluids while breastfeeding.  For baby, please call pediatrician with concerns.  Make sure you are seeing 6-8 wet diapers each day and 3-4 stools per day.   For breastfeeding, our lactations consultants are available for concerns or questions.   Vaginal Delivery, Care After Refer to this sheet in the next few weeks. These discharge instructions provide you with information on caring for yourself after delivery. Your health care provider may also give you specific instructions. Your treatment has been planned according to the most current medical practices available, but problems sometimes occur. Call your health care provider if you have any problems or questions after you go home. HOME CARE INSTRUCTIONS  Take over-the-counter or prescription medicines only as directed by your health care provider or pharmacist.  Do not drink alcohol, especially if you are breastfeeding or taking medicine to relieve pain.  Do not chew or smoke tobacco.  Do not use illegal drugs.  Continue to use good perineal care. Good perineal care includes:  Wiping your perineum from front to back.  Keeping your perineum clean.  Do not use tampons or douche until your health care provider says it is okay.  Shower, wash your hair, and take tub baths as directed by your health care provider.  Wear a well-fitting bra that provides breast  support.  Eat healthy foods.  Drink enough fluids to keep your urine clear or pale yellow.  Eat high-fiber foods such as whole grain cereals and breads, brown rice, beans, and fresh fruits and vegetables every day. These foods may help prevent or relieve constipation.  Follow your health care provider's recommendations regarding resumption of activities such as climbing stairs, driving, lifting, exercising, or traveling.  Talk to your health care provider about resuming sexual activities. Resumption of sexual activities is dependent upon your risk of infection, your rate of healing, and your comfort and desire to resume sexual activity.  Try to have someone help you with your household activities and your newborn for at least a few days after you leave the hospital.  Rest as much as possible. Try to rest or take a nap when your newborn is sleeping.  Increase your activities gradually.  Keep all of your scheduled postpartum appointments. It is very important to keep your scheduled follow-up appointments. At these appointments, your health care provider will be checking to make sure that you are healing physically and emotionally. SEEK MEDICAL CARE IF:   You are passing large clots from your vagina. Save any clots to show your health care provider.  You have a foul smelling discharge from your vagina.  You have trouble urinating.  You are urinating frequently.  You have pain when you urinate.  You have a change in your bowel movements.  You have increasing redness, pain, or swelling near your vaginal incision (episiotomy) or vaginal tear.  You have pus draining from your episiotomy or vaginal tear.  Your episiotomy or vaginal tear  is separating.  You have painful, hard, or reddened breasts.  You have a severe headache.  You have blurred vision or see spots.  You feel sad or depressed.  You have thoughts of hurting yourself or your newborn.  You have questions about your  care, the care of your newborn, or medicines.  You are dizzy or light-headed.  You have a rash.  You have nausea or vomiting.  You were breastfeeding and have not had a menstrual period within 12 weeks after you stopped breastfeeding.  You are not breastfeeding and have not had a menstrual period by the 12th week after delivery.  You have a fever. SEEK IMMEDIATE MEDICAL CARE IF:   You have persistent pain.  You have chest pain.  You have shortness of breath.  You faint.  You have leg pain.  You have stomach pain.  Your vaginal bleeding saturates two or more sanitary pads in 1 hour.   This information is not intended to replace advice given to you by your health care provider. Make sure you discuss any questions you have with your health care provider.   Document Released: 10/27/2000 Document Revised: 07/21/2015 Document Reviewed: 06/26/2012 Elsevier Interactive Patient Education 2016 [employer]  Vaginal Delivery, Care After Refer to this sheet in the next few weeks. These discharge instructions provide you with information on caring for yourself after delivery. Your health care provider may also give you specific instructions. Your treatment has been planned according to the most current medical practices available, but problems sometimes occur. Call your health care provider if you have any problems or questions after you go home. HOME CARE INSTRUCTIONS  Take over-the-counter or prescription medicines only as directed by your health care provider or pharmacist.  Do not drink alcohol, especially if you are breastfeeding or taking medicine to relieve pain.  Do not chew or smoke tobacco.  Do not use illegal drugs.  Continue to use good perineal care. Good perineal care includes:  Wiping your perineum from front to back.  Keeping your perineum clean.  Do not use tampons or douche until your health care provider says it is okay.  Shower, wash your hair, and take  tub baths as directed by your health care provider.  Wear a well-fitting bra that provides breast support.  Eat healthy foods.  Drink enough fluids to keep your urine clear or pale yellow.  Eat high-fiber foods such as whole grain cereals and breads, brown rice, beans, and fresh fruits and vegetables every day. These foods may help prevent or relieve constipation.  Follow your health care provider's recommendations regarding resumption of activities such as climbing stairs, driving, lifting, exercising, or traveling.  Talk to your health care provider about resuming sexual activities. Resumption of sexual activities is dependent upon your risk of infection, your rate of healing, and your comfort and desire to resume sexual activity.  Try to have someone help you with your household activities and your newborn for at least a few days after you leave the hospital.  Rest as much as possible. Try to rest or take a nap when your newborn is sleeping.  Increase your activities gradually.  Keep all of your scheduled postpartum appointments. It is very important to keep your scheduled follow-up appointments. At these appointments, your health care provider will be checking to make sure that you are healing physically and emotionally. SEEK MEDICAL CARE IF:   You are passing large clots from your vagina. Save any clots to show your  health care provider.  You have a foul smelling discharge from your vagina.  You have trouble urinating.  You are urinating frequently.  You have pain when you urinate.  You have a change in your bowel movements.  You have increasing redness, pain, or swelling near your vaginal incision (episiotomy) or vaginal tear.  You have pus draining from your episiotomy or vaginal tear.  Your episiotomy or vaginal tear is separating.  You have painful, hard, or reddened breasts.  You have a severe headache.  You have blurred vision or see spots.  You feel sad or  depressed.  You have thoughts of hurting yourself or your newborn.  You have questions about your care, the care of your newborn, or medicines.  You are dizzy or light-headed.  You have a rash.  You have nausea or vomiting.  You were breastfeeding and have not had a menstrual period within 12 weeks after you stopped breastfeeding.  You are not breastfeeding and have not had a menstrual period by the 12th week after delivery.  You have a fever. SEEK IMMEDIATE MEDICAL CARE IF:   You have persistent pain.  You have chest pain.  You have shortness of breath.  You faint.  You have leg pain.  You have stomach pain.  Your vaginal bleeding saturates two or more sanitary pads in 1 hour.   This information is not intended to replace advice given to you by your health care provider. Make sure you discuss any questions you have with your health care provider.   Document Released: 10/27/2000 Document Revised: 07/21/2015 Document Reviewed: 06/26/2012 Elsevier Interactive Patient Education [employer]

## 2015-08-31 NOTE — Progress Notes (Signed)
  August 31, 2015  Patient: Shelley Aguirre  Date of Birth: 06/10/1991  Date of Visit: 08/27/2015    To Whom It May Concern:  Gaye PollackXXXDevon Aguirre was seen and treated in at [facility]  from 08/27/2015 through 08/31/15.   Sincerely,  Lavella LemonsKim Bessie Livingood RN

## 2015-08-31 NOTE — Progress Notes (Signed)
D/C order from MD.  Reviewed d/c instructions and prescriptions with patient and answered any questions.  Patient d/c home with infant via wheelchair by nursing/auxillary. 

## 2015-09-24 ENCOUNTER — Ambulatory Visit (INDEPENDENT_AMBULATORY_CARE_PROVIDER_SITE_OTHER): Payer: Commercial Managed Care - HMO | Admitting: Family Medicine

## 2015-09-24 ENCOUNTER — Other Ambulatory Visit: Payer: Self-pay | Admitting: Family Medicine

## 2015-09-24 ENCOUNTER — Encounter: Payer: Self-pay | Admitting: Family Medicine

## 2015-09-24 VITALS — BP 120/88 | HR 94 | Temp 98.3°F | Ht 65.5 in | Wt 211.0 lb

## 2015-09-24 DIAGNOSIS — N3001 Acute cystitis with hematuria: Secondary | ICD-10-CM

## 2015-09-24 DIAGNOSIS — R399 Unspecified symptoms and signs involving the genitourinary system: Secondary | ICD-10-CM

## 2015-09-24 LAB — MICROSCOPIC EXAMINATION

## 2015-09-24 MED ORDER — CIPROFLOXACIN HCL 500 MG PO TABS: 500.0000 mg | ORAL_TABLET | Freq: Two times a day (BID) | ORAL | Status: DC

## 2015-09-24 NOTE — Progress Notes (Signed)
BP 120/88 mmHg  Pulse 94  Temp(Src) 98.3 F (36.8 C)  Ht 5' 5.5" (1.664 m)  Wt 211 lb (95.709 kg)  BMI 34.57 kg/m2  SpO2 98%  LMP 11/20/2014  Breastfeeding? Unknown   Subjective:    Patient ID: Shelley Aguirre, female    DOB: 07/25/1991, 24 y.o.   MRN: [id number]  HPI: Shelley Aguirre is a 24 y.o. female  Chief Complaint  Patient presents with  . Dysuria    Throbbing. Urine has strong smell.  . Hematuria    Patient is also bleeding mestrually.    URINARY SYMPTOMS Duration: 2 weeks Dysuria: yes Urinary frequency: about the same as when she was pregnant Urgency: yes Small volume voids: yes Symptom severity: moderate Urinary incontinence: no Foul odor: yes Hematuria: yes Abdominal pain: no Back pain: yes Suprapubic pain/pressure: yes Flank pain: yes Fever:  no, but chills and sweats Vomiting: no, really nauseous Relief with cranberry juice: no Relief with pyridium: no Status: worse Previous urinary tract infection: yes Recurrent urinary tract infection: yes Sexual activity: monogomous History of sexually transmitted disease: no Vaginal discharge: no Treatments attempted: increasing fluids   Relevant past medical, surgical, family and social history reviewed and updated as indicated. Interim medical history since our last visit reviewed. Allergies and medications reviewed and updated.  Review of Systems  Constitutional: Negative.   Respiratory: Negative.   Cardiovascular: Negative.   Gastrointestinal: Negative.   Genitourinary: Positive for dysuria, urgency, frequency, hematuria, flank pain and pelvic pain. Negative for decreased urine volume, vaginal bleeding, vaginal discharge, enuresis, difficulty urinating, genital sores, vaginal pain, menstrual problem and dyspareunia.  Psychiatric/Behavioral: Negative.     Per HPI unless specifically indicated above     Objective:    BP 120/88 mmHg  Pulse 94  Temp(Src) 98.3 F (36.8 C)  Ht 5' 5.5" (1.664 m)  Wt  211 lb (95.709 kg)  BMI 34.57 kg/m2  SpO2 98%  LMP 11/20/2014  Breastfeeding? Unknown  Wt Readings from Last 3 Encounters:  09/24/15 211 lb (95.709 kg)  08/28/15 236 lb (107.049 kg)  08/25/15 236 lb (107.049 kg)    Physical Exam  Constitutional: She is oriented to person, place, and time. She appears well-developed and well-nourished. No distress.  HENT:  Head: Normocephalic and atraumatic.  Right Ear: Hearing normal.  Left Ear: Hearing normal.  Nose: Nose normal.  Eyes: Conjunctivae and lids are normal. Right eye exhibits no discharge. Left eye exhibits no discharge. No scleral icterus.  Cardiovascular: Normal rate, regular rhythm, normal heart sounds and intact distal pulses.  Exam reveals no gallop and no friction rub.   No murmur heard. Pulmonary/Chest: Effort normal and breath sounds normal. No respiratory distress. She has no wheezes. She has no rales. She exhibits no tenderness.  Abdominal: Soft. Bowel sounds are normal. She exhibits no distension and no mass. There is tenderness in the suprapubic area. There is CVA tenderness. There is no rebound and no guarding.  Musculoskeletal: Normal range of motion.  Neurological: She is alert and oriented to person, place, and time.  Skin: Skin is warm, dry and intact. No rash noted. No erythema. No pallor.  Psychiatric: She has a normal mood and affect. Her speech is normal and behavior is normal. Judgment and thought content normal. Cognition and memory are normal.  Nursing note and vitals reviewed.   Results for orders placed or performed during the hospital encounter of 08/27/15  Chlamydia/NGC rt PCR [facility] only)  Result Value Ref Range   Specimen  source GC/Chlam URINE, RANDOM    Chlamydia Tr NOT DETECTED NOT DETECTED   N gonorrhoeae NOT DETECTED NOT DETECTED  Protein / creatinine ratio, urine  Result Value Ref Range   Creatinine, Urine 173 mg/dL   Total Protein, Urine 56 mg/dL   Protein Creatinine Ratio 0.32 (H) 0.00 - 0.15  mg/mg[Cre]  Comprehensive metabolic panel  Result Value Ref Range   Sodium 134 (L) 135 - 145 mmol/L   Potassium 3.7 3.5 - 5.1 mmol/L   Chloride 108 101 - 111 mmol/L   CO2 18 (L) 22 - 32 mmol/L   Glucose, Bld 93 65 - 99 mg/dL   BUN 7 6 - 20 mg/dL   Creatinine, Ser 8.41 (L) 0.44 - 1.00 mg/dL   Calcium 8.3 (L) 8.9 - 10.3 mg/dL   Total Protein 6.3 (L) 6.5 - 8.1 g/dL   Albumin 2.7 (L) 3.5 - 5.0 g/dL   AST 19 15 - 41 U/L   ALT 16 14 - 54 U/L   Alkaline Phosphatase 341 (H) 38 - 126 U/L   Total Bilirubin 0.4 0.3 - 1.2 mg/dL   GFR calc non Af Amer >60 >60 mL/min   GFR calc Af Amer >60 >60 mL/min   Anion gap 8 5 - 15  CBC  Result Value Ref Range   WBC 13.3 (H) 3.6 - 11.0 K/uL   RBC 4.33 3.80 - 5.20 MIL/uL   Hemoglobin 11.8 (L) 12.0 - 16.0 g/dL   HCT 32.4 40.1 - 02.7 %   MCV 81.0 80.0 - 100.0 fL   MCH 27.3 26.0 - 34.0 pg   MCHC 33.7 32.0 - 36.0 g/dL   RDW 25.3 66.4 - 40.3 %   Platelets 166 150 - 440 K/uL  Protime-INR  Result Value Ref Range   Prothrombin Time 14.0 11.4 - 15.0 seconds   INR 1.06   APTT  Result Value Ref Range   aPTT 33 24 - 36 seconds  HCV RNA quant  Result Value Ref Range   HCV Quantitative 107000 >50 IU/mL   HCV Quantitative Log 5.029 >1.70 log10 IU/mL   Test Information Comment   Urine Drug Screen, Qualitative ([facility] only)  Result Value Ref Range   Tricyclic, Ur Screen NONE DETECTED NONE DETECTED   Amphetamines, Ur Screen NONE DETECTED NONE DETECTED   MDMA (Ecstasy)Ur Screen NONE DETECTED NONE DETECTED   Cocaine Metabolite,Ur [place] NONE DETECTED NONE DETECTED   Opiate, Ur Screen NONE DETECTED NONE DETECTED   Phencyclidine (PCP) Ur S NONE DETECTED NONE DETECTED   Cannabinoid 50 Ng, Ur [place] NONE DETECTED NONE DETECTED   Barbiturates, Ur Screen NONE DETECTED NONE DETECTED   Benzodiazepine, Ur Scrn NONE DETECTED NONE DETECTED   Methadone Scn, Ur NONE DETECTED NONE DETECTED  RPR  Result Value Ref Range   RPR Ser Ql Non Reactive Non Reactive  Rapid HIV screen  (HIV 1/2 Ab+Ag) ([facility] Only)  Result Value Ref Range   HIV-1 P24 Antigen - HIV24 NON REACTIVE NON REACTIVE   HIV 1/2 Antibodies NON REACTIVE NON REACTIVE   Interpretation (HIV Ag Ab)      A non reactive test result means that HIV 1 or HIV 2 antibodies and HIV 1 p24 antigen were not detected in the specimen.  CBC  Result Value Ref Range   WBC 20.2 (H) 3.6 - 11.0 K/uL   RBC 3.84 3.80 - 5.20 MIL/uL   Hemoglobin 10.6 (L) 12.0 - 16.0 g/dL   HCT 47.4 (L) 25.9 - 56.3 %  MCV 80.9 80.0 - 100.0 fL   MCH 27.6 26.0 - 34.0 pg   MCHC 34.1 32.0 - 36.0 g/dL   RDW 91.414.2 78.211.5 - 95.614.5 %   Platelets 140 (L) 150 - 440 K/uL  Type and screen  Result Value Ref Range   ABO/RH(D) O POS    Antibody Screen NEG    Sample Expiration 08/31/2015   ABO/Rh  Result Value Ref Range   ABO/RH(D) O POS       Assessment & Plan:   Problem List Items Addressed This Visit    None    Visit Diagnoses    Acute cystitis with hematuria    -  Primary    Given CVA tenderness, will treat with cipro. Not breast feeding now, won't breast feed for 2 weeks around medicine. Push fluids. Continue to monitor.     UTI symptoms        Will check UA        Follow up plan: No Follow-up on file.

## 2015-09-25 ENCOUNTER — Emergency Department
Admission: EM | Admit: 2015-09-25 | Discharge: 2015-09-25 | Disposition: A | Discharge status: Home / Self Care | Payer: Commercial Managed Care - HMO | Attending: Emergency Medicine | Admitting: Emergency Medicine

## 2015-09-25 ENCOUNTER — Encounter: Payer: Self-pay | Admitting: *Deleted

## 2015-09-25 DIAGNOSIS — N390 Urinary tract infection, site not specified: Secondary | ICD-10-CM

## 2015-09-25 DIAGNOSIS — Z79899 Other long term (current) drug therapy: Secondary | ICD-10-CM | POA: Diagnosis not present

## 2015-09-25 DIAGNOSIS — Z880 Allergy status to penicillin: Secondary | ICD-10-CM | POA: Insufficient documentation

## 2015-09-25 DIAGNOSIS — B9689 Other specified bacterial agents as the cause of diseases classified elsewhere: Secondary | ICD-10-CM | POA: Diagnosis not present

## 2015-09-25 DIAGNOSIS — O8620 Urinary tract infection following delivery, unspecified: Secondary | ICD-10-CM | POA: Diagnosis present

## 2015-09-25 DIAGNOSIS — Z791 Long term (current) use of non-steroidal anti-inflammatories (NSAID): Secondary | ICD-10-CM | POA: Diagnosis not present

## 2015-09-25 DIAGNOSIS — Z87891 Personal history of nicotine dependence: Secondary | ICD-10-CM | POA: Diagnosis not present

## 2015-09-25 DIAGNOSIS — Z792 Long term (current) use of antibiotics: Secondary | ICD-10-CM | POA: Insufficient documentation

## 2015-09-25 LAB — URINALYSIS COMPLETE WITH MICROSCOPIC (ARMC ONLY)
Bilirubin Urine: NEGATIVE
Glucose, UA: NEGATIVE mg/dL
Ketones, ur: NEGATIVE mg/dL
NITRITE: NEGATIVE
PH: 5 (ref 5.0–8.0)
PROTEIN: NEGATIVE mg/dL
SPECIFIC GRAVITY, URINE: 1.006 (ref 1.005–1.030)

## 2015-09-25 MED ORDER — CIPROFLOXACIN HCL 500 MG PO TABS
500.0000 mg | ORAL_TABLET | Freq: Once | ORAL | Status: AC
  Administered 2015-09-25: 500 mg via ORAL
  Filled 2015-09-25: qty 1

## 2015-09-25 MED ORDER — PHENAZOPYRIDINE HCL 100 MG PO TABS: 100.0000 mg | ORAL_TABLET | Freq: Three times a day (TID) | ORAL | Status: DC

## 2015-09-25 MED ORDER — PHENAZOPYRIDINE HCL 200 MG PO TABS
200.0000 mg | ORAL_TABLET | Freq: Three times a day (TID) | ORAL | Status: DC
  Administered 2015-09-25: 200 mg via ORAL
  Filled 2015-09-25: qty 1

## 2015-09-25 NOTE — Discharge Instructions (Signed)
Your urine does show signs of a urinary tract infection with 6-30 white blood cells per field, 3+ leukocyte esterase. Continue to take Cipro. Follow-up with your regular doctor early this coming week if you're continuing to have symptoms or if you have other concerns. Return to the emergency department if her pain worsens, if you have fever, or if you have other urgent concerns.  Urinary Tract Infection Urinary tract infections (UTIs) can develop anywhere along your urinary tract. Your urinary tract is your body's drainage system for removing wastes and extra water. Your urinary tract includes two kidneys, two ureters, a bladder, and a urethra. Your kidneys are a pair of bean-shaped organs. Each kidney is about the size of your fist. They are located below your ribs, one on each side of your spine. CAUSES Infections are caused by microbes, which are microscopic organisms, including fungi, viruses, and bacteria. These organisms are so small that they can only be seen through a microscope. Bacteria are the microbes that most commonly cause UTIs. SYMPTOMS  Symptoms of UTIs may vary by age and gender of the patient and by the location of the infection. Symptoms in young women typically include a frequent and intense urge to urinate and a painful, burning feeling in the bladder or urethra during urination. Older women and men are more likely to be tired, shaky, and weak and have muscle aches and abdominal pain. A fever may mean the infection is in your kidneys. Other symptoms of a kidney infection include pain in your back or sides below the ribs, nausea, and vomiting. DIAGNOSIS To diagnose a UTI, your caregiver will ask you about your symptoms. Your caregiver will also ask you to provide a urine sample. The urine sample will be tested for bacteria and white blood cells. White blood cells are made by your body to help fight infection. TREATMENT  Typically, UTIs can be treated with medication. Because most  UTIs are caused by a bacterial infection, they usually can be treated with the use of antibiotics. The choice of antibiotic and length of treatment depend on your symptoms and the type of bacteria causing your infection. HOME CARE INSTRUCTIONS  If you were prescribed antibiotics, take them exactly as your caregiver instructs you. Finish the medication even if you feel better after you have only taken some of the medication.  Drink enough water and fluids to keep your urine clear or pale yellow.  Avoid caffeine, tea, and carbonated beverages. They tend to irritate your bladder.  Empty your bladder often. Avoid holding urine for long periods of time.  Empty your bladder before and after sexual intercourse.  After a bowel movement, women should cleanse from front to back. Use each tissue only once. SEEK MEDICAL CARE IF:   You have back pain.  You develop a fever.  Your symptoms do not begin to resolve within 3 days. SEEK IMMEDIATE MEDICAL CARE IF:   You have severe back pain or lower abdominal pain.  You develop chills.  You have nausea or vomiting.  You have continued burning or discomfort with urination. MAKE SURE YOU:   Understand these instructions.  Will watch your condition.  Will get help right away if you are not doing well or get worse.   This information is not intended to replace advice given to you by your health care provider. Make sure you discuss any questions you have with your health care provider.   Document Released: 08/09/2005 Document Revised: 07/21/2015 Document Reviewed: 12/08/2011 Elsevier Interactive  Patient Education 2016 [employer].

## 2015-09-25 NOTE — ED Provider Notes (Signed)
[facility] Emergency Department Provider Note  ____________________________________________  Time seen: 7:30  I have reviewed the triage vital signs and the nursing notes.   HISTORY  Chief Complaint Urinary Tract Infection     HPI Shelley Aguirre is a 24 y.o. female delivered a child one month ago. She has been having some right-sided abdominal aching into her right back. She has now developed some discomfort towards the end of her urination. She saw her primary physician yesterday and was begun on Cipro. She took one dose last night. She will come this morning with some ongoing discomfort area she reports she has come in because it hurts when she gets up to take care of her child and she is hoping that we can do something else to speed her recovery. She denies any fever, though she does report she has occasional chills.  She has had some nausea for which she has taken Phenergan.    Past Medical History  Diagnosis Date  . Asthma   . GERD (gastroesophageal reflux disease)   . Hyperlipidemia   . Bipolar 2 disorder (HCC)   . Circumvallate placenta   . Hepatitis C     Patient Active Problem List   Diagnosis Date Noted  . Labor and delivery, indication for care 08/28/2015  . Preeclampsia 08/28/2015  . Hepatitis C   . Asthma   . Circumvallate placenta   . Pregnancy 04/16/2015  . Bipolar 2 disorder (HCC) 04/16/2015    Past Surgical History  Procedure Laterality Date  . Tonsillectomy  2007  . Appendectomy      2000  . Wisdom tooth extraction      Current Outpatient Rx  Name  Route  Sig  Dispense  Refill  . ciprofloxacin (CIPRO) 500 MG tablet   Oral   Take 1 tablet (500 mg total) by mouth 2 (two) times daily.   14 tablet   0   . hydrOXYzine (ATARAX/VISTARIL) 10 MG tablet   Oral   Take 20 mg by mouth at bedtime.         Marland Kitchen ibuprofen (ADVIL,MOTRIN) 600 MG tablet   Oral   Take 1 tablet (600 mg total) by mouth every 6 (six) hours.   30  tablet   0   . omeprazole (PRILOSEC) 20 MG capsule   Oral   Take 1 capsule (20 mg total) by mouth daily.   30 capsule   6   . phenazopyridine (PYRIDIUM) 100 MG tablet   Oral   Take 1 tablet (100 mg total) by mouth 3 (three) times daily with meals.   3 tablet   0   . Prenatal Vit-Fe Fumarate-FA (MULTIVITAMIN-PRENATAL) 27-0.8 MG TABS tablet   Oral   Take 1 tablet by mouth daily at 12 noon.           Allergies Flu virus vaccine and Penicillins  Family History  Problem Relation Age of Onset  . Cancer Maternal Grandmother     colon  . Diabetes Maternal Grandfather   . Heart disease Paternal Grandmother   . Crohn's disease Paternal Grandmother   . Autoimmune disease Father   . Crohn's disease Father     Social History Social History  Substance Use Topics  . Smoking status: Former Smoker    Quit date: 11/13/2014  . Smokeless tobacco: Never Used  . Alcohol Use: No    Review of Systems  Constitutional: Negative for fatigue. ENT: Negative for congestion. Cardiovascular: Negative for chest pain. Respiratory:  Negative for cough. Gastrointestinal: Negative for abdominal pain, vomiting and diarrhea. Genitourinary: One-month status post vaginal delivery. Discomfort towards the end of urination. Some right-sided torso discomfort. See history of present illness Musculoskeletal: No myalgias or injuries. Skin: Negative for rash. Neurological: Negative for headache or focal weakness   10-point ROS otherwise negative.  ____________________________________________   PHYSICAL EXAM:  VITAL SIGNS: ED Triage Vitals  Enc Vitals Group     BP 09/25/15 0621 126/84 mmHg     Pulse Rate 09/25/15 0621 86     Resp 09/25/15 0621 20     Temp 09/25/15 0621 98.3 F (36.8 C)     Temp Source 09/25/15 0621 Oral     SpO2 09/25/15 0621 99 %     Weight 09/25/15 0621 206 lb (93.441 kg)     Height 09/25/15 0621 5\' 5"  (1.651 m)     Head Cir --      Peak Flow --      Pain Score  09/25/15 0621 5     Pain Loc --      Pain Edu? --      Excl. in GC? --     Constitutional: Alert and oriented. Well appearing and in no distress. ENT   Head: Normocephalic and atraumatic.   Nose: No congestion/rhinnorhea.       Mouth: No erythema, no swelling   Cardiovascular: Normal rate, regular rhythm, no murmur noted Respiratory:  Normal respiratory effort, no tachypnea.    Breath sounds are clear and equal bilaterally.  Gastrointestinal: Soft and nontender. No distention.  Back: No muscle spasm, minimal tenderness, no CVA tenderness. Musculoskeletal: No deformity noted. Nontender with normal range of motion in all extremities.  No noted edema. Neurologic:  Communicative. Normal appearing spontaneous movement in all 4 extremities. No gross focal neurologic deficits are appreciated.  Skin:  Skin is warm, dry. No rash noted. Psychiatric: Mood and affect are normal. Speech and behavior are normal.  ____________________________________________    LABS (pertinent positives/negatives)  Labs Reviewed  URINALYSIS COMPLETEWITH MICROSCOPIC ([facility] ONLY) - Abnormal; Notable for the following:    Color, Urine YELLOW (*)    APPearance CLOUDY (*)    Hgb urine dipstick 2+ (*)    Leukocytes, UA 3+ (*)    Bacteria, UA MANY (*)    Squamous Epithelial / LPF 6-30 (*)    All other components within normal limits     ____________________________________________  INITIAL IMPRESSION / ASSESSMENT AND PLAN / ED COURSE  Pertinent labs & imaging results that were available during my care of the patient were reviewed by me and considered in my medical decision making (see chart for details).  Overall well-appearing 24 year old female 1 month status post vaginal delivery. She has been begun on Cipro for a urinary tract infection. She has minimal tenderness on light palpation of her back on both sides but has no noted CVA tenderness. She is afebrile here with no noted temperature at home.   We  are rechecking the urinalysis and treating the patient with her morning dose of Cipro. She and I also spoke about peridium. She would like to you that a try. She is not currently breast-feeding. We spoke about the pros and cons of doing blood tests, but I do not see any now calm and clinical actions based on blood tests given her benign appearance. Patient agrees with no blood testing.  ----------------------------------------- 8:21 AM on 09/25/2015 -----------------------------------------  Urinalysis shows 6-30 white blood cells, 3+ leukocyte esterase, many bacteria, and  a cloudy appearance.  The patient has had one dose of Cipro at home last night and one dose this morning. I think it is appropriate imprudent to allow her to continue with the Cipro without any further testing at this time. We will add a prescription for Pyridium, 100 mg, twice a day today and tomorrow. The patient is not breast-feeding but is currently pumping and wasting. The patient looks well. She agrees this plan. I'll ask her to follow-up with her doctor at [facility] family practice.  ____________________________________________   FINAL CLINICAL IMPRESSION(S) / ED DIAGNOSES  Final diagnoses:  UTI (lower urinary tract infection)      Darien Ramus, MD 09/25/15 [phone]

## 2015-09-25 NOTE — ED Notes (Signed)
Currently began Tx for UTI yesterday, on Cipro.  P c/o increase pain this AM.

## 2015-09-25 NOTE — ED Notes (Addendum)
Pt c/o dysuria and low back pain x 2 weeks. Pt recently gave birth and had a foley catheter at that time. Pt states she woke at 0400 w/ nausea and vomiting. Pt diagnosed w/ UTI and started Cipro yesterday, has taken 1 dose.

## 2015-09-26 LAB — URINE CULTURE

## 2015-09-28 LAB — URINE CULTURE, REFLEX

## 2015-09-29 LAB — UA/M W/RFLX CULTURE, ROUTINE

## 2015-12-01 ENCOUNTER — Ambulatory Visit
Admission: RE | Admit: 2015-12-01 | Discharge: 2015-12-01 | Disposition: A | Discharge status: Home / Self Care | Payer: Commercial Managed Care - HMO | Source: Ambulatory Visit | Attending: Family Medicine | Admitting: Family Medicine

## 2015-12-01 ENCOUNTER — Encounter: Payer: Self-pay | Admitting: Family Medicine

## 2015-12-01 ENCOUNTER — Telehealth: Payer: Self-pay | Admitting: Family Medicine

## 2015-12-01 ENCOUNTER — Ambulatory Visit (INDEPENDENT_AMBULATORY_CARE_PROVIDER_SITE_OTHER): Payer: Commercial Managed Care - HMO | Admitting: Family Medicine

## 2015-12-01 VITALS — BP 119/76 | HR 83 | Temp 98.8°F | Ht 64.6 in | Wt 187.0 lb

## 2015-12-01 DIAGNOSIS — M9908 Segmental and somatic dysfunction of rib cage: Secondary | ICD-10-CM

## 2015-12-01 DIAGNOSIS — R1011 Right upper quadrant pain: Secondary | ICD-10-CM | POA: Insufficient documentation

## 2015-12-01 DIAGNOSIS — M999 Biomechanical lesion, unspecified: Secondary | ICD-10-CM

## 2015-12-01 LAB — CBC WITH DIFFERENTIAL/PLATELET
HEMOGLOBIN: 12.6 g/dL (ref 11.1–15.9)
Hematocrit: 37 % (ref 34.0–46.6)
Lymphocytes Absolute: 2.4 10*3/uL (ref 0.7–3.1)
Lymphs: 47 %
MCH: 29.9 pg (ref 26.6–33.0)
MCHC: 34.1 g/dL (ref 31.5–35.7)
MCV: 88 fL (ref 79–97)
MID (Absolute): 0.4 10*3/uL (ref 0.1–1.6)
MID: 8 %
NEUTROS ABS: 2.2 10*3/uL (ref 1.4–7.0)
Neutrophils: 45 %
Platelets: 170 10*3/uL (ref 150–379)
RBC: 4.21 x10E6/uL (ref 3.77–5.28)
RDW: 14.7 % (ref 12.3–15.4)
WBC: 5 10*3/uL (ref 3.4–10.8)

## 2015-12-01 MED ORDER — OMEPRAZOLE 20 MG PO CPDR: 20.0000 mg | DELAYED_RELEASE_CAPSULE | Freq: Every day | ORAL | Status: AC

## 2015-12-01 MED ORDER — OMEPRAZOLE 20 MG PO CPDR: 20.0000 mg | DELAYED_RELEASE_CAPSULE | Freq: Every day | ORAL | Status: DC

## 2015-12-01 NOTE — Telephone Encounter (Signed)
I let patient know that her RUQ [place] was normal; she can talk with Dr. Laural Benes about next step; I am on-call tonight, so please do contact me if anything changes

## 2015-12-01 NOTE — Progress Notes (Signed)
BP 119/76 mmHg  Pulse 83  Temp(Src) 98.8 F (37.1 C)  Ht 5' 4.6" (1.641 m)  Wt 187 lb (84.823 kg)  BMI 31.50 kg/m2  SpO2 99%  LMP 10/29/2015 (Exact Date)  Breastfeeding? No   Subjective:    Patient ID: Shelley Aguirre, female    DOB: 1991/04/01, 25 y.o.   MRN: [id number]  HPI: Shelley Aguirre is a 25 y.o. female  Chief Complaint  Patient presents with  . Chest Pain   CHEST PAIN Time since onset: 3 days Onset: sudden Quality: dull- then became more of sharp cramp Severity: mild Location: RUQ of belly Radiation: back Episode duration: hours   Frequency: constant Related to exertion: no Trauma: no Anxiety/recent stressors: no Aggravating factors: eating and standing for a long time Alleviating factors: hunching over or sitting Status: fluctuating Treatments attempted: nothing  Current pain status: pain free Shortness of breath: no Cough: yes Nausea: yes Diaphoresis: yes Heartburn: yes Palpitations: no  Fevers: No Diarrhea: No Constipation: Yes   Relevant past medical, surgical, family and social history reviewed and updated as indicated. Interim medical history since our last visit reviewed. Allergies and medications reviewed and updated.  Review of Systems  Constitutional: Negative.   Respiratory: Negative.   Cardiovascular: Negative.   Gastrointestinal: Negative.   Psychiatric/Behavioral: Negative.     Per HPI unless specifically indicated above     Objective:    BP 119/76 mmHg  Pulse 83  Temp(Src) 98.8 F (37.1 C)  Ht 5' 4.6" (1.641 m)  Wt 187 lb (84.823 kg)  BMI 31.50 kg/m2  SpO2 99%  LMP 10/29/2015 (Exact Date)  Breastfeeding? No  Wt Readings from Last 3 Encounters:  12/01/15 187 lb (84.823 kg)  09/25/15 206 lb (93.441 kg)  09/24/15 211 lb (95.709 kg)    Physical Exam  Constitutional: She is oriented to person, place, and time. She appears well-developed and well-nourished. No distress.  HENT:  Head: Normocephalic and atraumatic.  Right  Ear: Hearing normal.  Left Ear: Hearing normal.  Nose: Nose normal.  Eyes: Conjunctivae and lids are normal. Right eye exhibits no discharge. Left eye exhibits no discharge. No scleral icterus.  Cardiovascular: Normal rate, regular rhythm and intact distal pulses.  Exam reveals no gallop and no friction rub.   No murmur heard. Pulmonary/Chest: Effort normal and breath sounds normal. No respiratory distress. She has no wheezes. She has no rales. She exhibits no tenderness.  Abdominal: Soft. Bowel sounds are normal. She exhibits no distension and no mass. There is tenderness. There is positive Murphy's sign. There is no rebound and no guarding.  Musculoskeletal: Normal range of motion.  Neurological: She is alert and oriented to person, place, and time.  Skin: Skin is warm, dry and intact. No rash noted. No erythema. No pallor.  Psychiatric: She has a normal mood and affect. Her speech is normal and behavior is normal. Judgment and thought content normal. Cognition and memory are normal.  Nursing note and vitals reviewed. Musculoskeletal:  Exam found Decreased ROM, Tissue texture changes, Tenderness to palpation and Asymmetry of patient's  ribs Osteopathic Structural Exam:   Ribs: Ribs 5-9 locked up on the L, Ribs 3-6 locked up on the R      Assessment & Plan:   Problem List Items Addressed This Visit    None    Visit Diagnoses    RUQ abdominal pain    -  Primary    Given recent weight changes, and tenderness, will check labs and  obtain RUQ ultrasound to r/o gall stones. Continue to monitor. CBC normal today.     Relevant Orders    Comprehensive metabolic panel    Amylase    Lipase    US Abdomen Limited RUQ    CBC With Differential/Platelet    Nonallopathic lesion of rib cage          After verbal consent was obtained, patient was treated today with osteopathic manipulative medicine to the regions of the ribs using the techniques of HVLA. Areas of compensation relating to her primary  pain source also treated. Patient tolerated the procedure well with good objective and good subjective improvement in symptoms.  She left the room in good condition. She was advised to stay well hydrated and that she may have some soreness following the procedure. If not improving or worsening, she will call and come in. She will return for reevaluation  on a PRN basis.   Follow up plan: Return if symptoms worsen or fail to improve.

## 2015-12-02 LAB — COMPREHENSIVE METABOLIC PANEL
ALT: 62 IU/L — ABNORMAL HIGH (ref 0–32)
AST: 52 IU/L — AB (ref 0–40)
Albumin/Globulin Ratio: 2 (ref 1.1–2.5)
Albumin: 4.4 g/dL (ref 3.5–5.5)
Alkaline Phosphatase: 80 IU/L (ref 39–117)
BUN/Creatinine Ratio: 14 (ref 8–20)
BUN: 9 mg/dL (ref 6–20)
Bilirubin Total: <0.2 mg/dL (ref 0.0–1.2)
CALCIUM: 8.6 mg/dL — AB (ref 8.7–10.2)
CO2: 18 mmol/L (ref 18–29)
CREATININE: 0.66 mg/dL (ref 0.57–1.00)
Chloride: 103 mmol/L (ref 96–106)
GFR calc Af Amer: 143 mL/min/{1.73_m2} (ref >59)
GFR, EST NON AFRICAN AMERICAN: 124 mL/min/{1.73_m2} (ref >59)
GLOBULIN, TOTAL: 2.2 g/dL (ref 1.5–4.5)
Glucose: 87 mg/dL (ref 65–99)
Potassium: 3.8 mmol/L (ref 3.5–5.2)
SODIUM: 139 mmol/L (ref 134–144)
TOTAL PROTEIN: 6.6 g/dL (ref 6.0–8.5)

## 2015-12-02 LAB — AMYLASE: Amylase: 16 U/L — ABNORMAL LOW (ref 31–124)

## 2015-12-02 LAB — LIPASE: LIPASE: 30 U/L (ref 0–59)

## 2015-12-02 NOTE — Telephone Encounter (Signed)
Belly is feeling better. Back is hurting. Will start omeprazole and see how she does. Needs appointment for back. Will come in on Monday at 11AM. Call with any concerns. Results given to patient.

## 2015-12-06 ENCOUNTER — Encounter: Payer: Self-pay | Admitting: Family Medicine

## 2015-12-06 ENCOUNTER — Ambulatory Visit (INDEPENDENT_AMBULATORY_CARE_PROVIDER_SITE_OTHER): Payer: Commercial Managed Care - HMO | Admitting: Family Medicine

## 2015-12-06 VITALS — BP 119/84 | HR 93 | Temp 98.0°F | Ht 65.5 in | Wt 188.0 lb

## 2015-12-06 DIAGNOSIS — M9905 Segmental and somatic dysfunction of pelvic region: Secondary | ICD-10-CM | POA: Diagnosis not present

## 2015-12-06 DIAGNOSIS — M9903 Segmental and somatic dysfunction of lumbar region: Secondary | ICD-10-CM | POA: Diagnosis not present

## 2015-12-06 DIAGNOSIS — M9909 Segmental and somatic dysfunction of abdomen and other regions: Secondary | ICD-10-CM

## 2015-12-06 DIAGNOSIS — M9900 Segmental and somatic dysfunction of head region: Secondary | ICD-10-CM | POA: Diagnosis not present

## 2015-12-06 DIAGNOSIS — M9901 Segmental and somatic dysfunction of cervical region: Secondary | ICD-10-CM

## 2015-12-06 DIAGNOSIS — M9908 Segmental and somatic dysfunction of rib cage: Secondary | ICD-10-CM | POA: Diagnosis not present

## 2015-12-06 DIAGNOSIS — M999 Biomechanical lesion, unspecified: Secondary | ICD-10-CM

## 2015-12-06 DIAGNOSIS — M9902 Segmental and somatic dysfunction of thoracic region: Secondary | ICD-10-CM

## 2015-12-06 DIAGNOSIS — M546 Pain in thoracic spine: Secondary | ICD-10-CM | POA: Diagnosis not present

## 2015-12-06 DIAGNOSIS — M9904 Segmental and somatic dysfunction of sacral region: Secondary | ICD-10-CM

## 2015-12-06 MED ORDER — CYCLOBENZAPRINE HCL 10 MG PO TABS: 10.0000 mg | ORAL_TABLET | Freq: Every day | ORAL | Status: AC

## 2015-12-06 NOTE — Progress Notes (Signed)
BP 119/84 mmHg  Pulse 93  Temp(Src) 98 F (36.7 C)  Ht 5' 5.5" (1.664 m)  Wt 188 lb (85.276 kg)  BMI 30.80 kg/m2  SpO2 99%  LMP 12/04/2015 (Exact Date)   Subjective:    Patient ID: Shelley Aguirre, female    DOB: 1990-12-04, 25 y.o.   MRN: [id number]  HPI: Shelley Aguirre is a 25 y.o. female  Chief Complaint  Patient presents with  . OMM   Akaila notes that her belly is feeling much better and she hasn't had much pain in it since her last visit, but her back has really been acting up. She has been lifting weights and doing cardio and isn't sure if she is using the right form. She also notes that she has been carrying her baby a lot and that makes her back feel tight. Her back pain is in her upper back bilaterally R>L with a lot of pain in her shoulders. She notes that it feels tight and sore. No radiation. It's better with OMT and she would like to be treated again. It's worse with standing for long periods and lifting heavy things. No numbness or tingling. She has otherwise been feeling well with no other concerns or complaints at this time.   Relevant past medical, surgical, family and social history reviewed and updated as indicated. Interim medical history since our last visit reviewed. Allergies and medications reviewed and updated.  Review of Systems  Constitutional: Negative.   Respiratory: Negative.   Cardiovascular: Negative.   Musculoskeletal: Positive for myalgias, back pain and neck stiffness. Negative for joint swelling, arthralgias, gait problem and neck pain.    Per HPI unless specifically indicated above     Objective:    BP 119/84 mmHg  Pulse 93  Temp(Src) 98 F (36.7 C)  Ht 5' 5.5" (1.664 m)  Wt 188 lb (85.276 kg)  BMI 30.80 kg/m2  SpO2 99%  LMP 12/04/2015 (Exact Date)  Wt Readings from Last 3 Encounters:  12/06/15 188 lb (85.276 kg)  12/01/15 187 lb (84.823 kg)  09/25/15 206 lb (93.441 kg)    Physical Exam  Constitutional: She is oriented to person,  place, and time. She appears well-developed and well-nourished. No distress.  HENT:  Head: Normocephalic and atraumatic.  Right Ear: Hearing normal.  Left Ear: Hearing normal.  Nose: Nose normal.  Eyes: Conjunctivae and lids are normal. Right eye exhibits no discharge. Left eye exhibits no discharge. No scleral icterus.  Pulmonary/Chest: Effort normal. No respiratory distress.  Abdominal: Soft. Bowel sounds are normal. She exhibits no distension and no mass. There is no tenderness. There is no rebound and no guarding.  Neurological: She is alert and oriented to person, place, and time.  Skin: Skin is warm, dry and intact. No rash noted. No erythema. No pallor.  Psychiatric: She has a normal mood and affect. Her speech is normal and behavior is normal. Judgment and thought content normal. Cognition and memory are normal.  Nursing note and vitals reviewed. Musculoskeletal:  Exam found Decreased ROM, Tissue texture changes, Tenderness to palpation and Asymmetry of patient's  head, neck, thorax, ribs, lumbar, pelvis, sacrum and abdomen Osteopathic Structural Exam:   Head: OAESSR, OM suture restricted on the R,  R side bending and rotation.   Neck: Trap spasm bilaterally R>L, [id number]  Thorax: Trap spasm bilaterally R>>L, [id number]  Ribs: Ribs 6-10 locked up on the R, Rib 5 locked up on the L  Lumbar: QL hypertonic on the R  Pelvis: Posterior R innominate, pelvic inflare bilaterally  Sacrum: R on R torsion, SI joint restricted on the R  Abdomen: diaphragm spasm on the R   Results for orders placed or performed in visit on 12/01/15  Comprehensive metabolic panel  Result Value Ref Range   Glucose 87 65 - 99 mg/dL   BUN 9 6 - 20 mg/dL   Creatinine, Ser 1.61 0.57 - 1.00 mg/dL   GFR calc non Af Amer 124 >59 mL/min/1.73   GFR calc Af Amer 143 >59 mL/min/1.73   BUN/Creatinine Ratio 14 8 - 20   Sodium 139 134 - 144 mmol/L   Potassium 3.8 3.5 - 5.2 mmol/L   Chloride 103 96 - 106 mmol/L   CO2 18  18 - 29 mmol/L   Calcium 8.6 (L) 8.7 - 10.2 mg/dL   Total Protein 6.6 6.0 - 8.5 g/dL   Albumin 4.4 3.5 - 5.5 g/dL   Globulin, Total 2.2 1.5 - 4.5 g/dL   Albumin/Globulin Ratio 2.0 1.1 - 2.5   Bilirubin Total <0.2 0.0 - 1.2 mg/dL   Alkaline Phosphatase 80 39 - 117 IU/L   AST 52 (H) 0 - 40 IU/L   ALT 62 (H) 0 - 32 IU/L  Amylase  Result Value Ref Range   Amylase 16 (L) 31 - 124 U/L  Lipase  Result Value Ref Range   Lipase 30 0 - 59 U/L  CBC With Differential/Platelet  Result Value Ref Range   WBC 5.0 3.4 - 10.8 x10E3/uL   RBC 4.21 3.77 - 5.28 x10E6/uL   Hemoglobin 12.6 11.1 - 15.9 g/dL   Hematocrit 09.6 04.5 - 46.6 %   MCV 88 79 - 97 fL   MCH 29.9 26.6 - 33.0 pg   MCHC 34.1 31.5 - 35.7 g/dL   RDW 40.9 81.1 - 91.4 %   Platelets 170 150 - 379 x10E3/uL   Neutrophils 45 %   Lymphs 47 %   MID 8 %   Neutrophils Absolute 2.2 1.4 - 7.0 x10E3/uL   Lymphocytes Absolute 2.4 0.7 - 3.1 x10E3/uL   MID (Absolute) 0.4 0.1 - 1.6 X10E3/uL      Assessment & Plan:   Problem List Items Addressed This Visit      Other   Bilateral thoracic back pain - Primary    Upper back pain seems myofascial in nature and is likely from carrying her son and improper form at the gym. Will treat with flexeril qHS PRN to try to help with the spasm. She does have some somatic dysfunction that I think is contributing to her symptoms. I think she would benefit from OMT. Patient treated today with good results as discussed below. Return for revaluation in 3 weeks.       Relevant Medications   cyclobenzaprine (FLEXERIL) 10 MG tablet    Other Visit Diagnoses    Nonallopathic lesion of rib cage        Segmental dysfunction of pelvic region        Somatic dysfunction of spine, sacral        Somatic dysfunction of spine, lumbar        Somatic dysfunction of abdominal region        Somatic dysfunction of head region        Thoracic segment dysfunction        Cervical segment dysfunction        Relevant  Medications    cyclobenzaprine (FLEXERIL) 10 MG tablet      After  verbal consent was obtained, patient was treated today with osteopathic manipulative medicine to the regions of the head, neck, thorax, ribs, lumbar, pelvis, sacrum and abdomen using the techniques of Still, FPR, myofascial release, counterstrain, muscle energy, HVLA and soft tissue. Areas of compensation relating to her primary pain source also treated. Patient tolerated the procedure well with good objective and good subjective improvement in symptoms. She left the room in good condition. She was advised to stay well hydrated and that she may have some soreness following the procedure. If not improving or worsening, she will call and come in. Home exercise program of stretches for traps discussed and demonstrated today. Patient will do these stretches BID to before the point of pain, and will return for reevaluation   in 2-3 weeks.   Follow up plan: Return in about 2 weeks (around 12/20/2015).

## 2015-12-06 NOTE — Assessment & Plan Note (Signed)
Upper back pain seems myofascial in nature and is likely from carrying her son and improper form at the gym. Will treat with flexeril qHS PRN to try to help with the spasm. She does have some somatic dysfunction that I think is contributing to her symptoms. I think she would benefit from OMT. Patient treated today with good results as discussed below. Return for revaluation in 3 weeks.

## 2015-12-06 NOTE — Patient Instructions (Addendum)
Trapezius Spasm EXERCISES RANGE OF MOTION (ROM) AND STRETCHING EXERCISES  These exercises may help you when beginning to rehabilitate your injury. Your symptoms may resolve with or without further involvement from your physician, physical therapist or athletic trainer. While completing these exercises, remember:   Restoring tissue flexibility helps normal motion to return to the joints. This allows healthier, less painful movement and activity.  An effective stretch should be held for at least 30 seconds.  A stretch should never be painful. You should only feel a gentle lengthening or release in the stretched tissue. STRETCH - Flexion, Standing  Stand with good posture. With an underhand grip on your right / left and an overhand grip on the opposite hand, grasp a broomstick or cane so that your hands are a little more than shoulder-width apart.  Keeping your right / left elbow straight and shoulder muscles relaxed, push the stick with your opposite hand to raise your right / left arm in front of your body and then overhead. Raise your arm until you feel a stretch in your right / left shoulder, but before you have increased shoulder pain.  Try to avoid shrugging your right / left shoulder as your arm rises by keeping your shoulder blade tucked down and toward your mid-back spine. Hold __________ seconds.  Slowly return to the starting position. Repeat __________ times. Complete this exercise __________ times per day.  STRETCH - Abduction, Supine  Stand with good posture. With an underhand grip on your right / left and an overhand grip on the opposite hand, grasp a broomstick or cane so that your hands are a little more than shoulder-width apart.  Keeping your right / left elbow straight and shoulder muscles relaxed, push the stick with your opposite hand to raise your right / left arm out to the side of your body and then overhead. Raise your arm until you feel a stretch in your right / left  shoulder, but before you have increased shoulder pain.  Try to avoid shrugging your right / left shoulder as your arm rises by keeping your shoulder blade tucked down and toward your mid-back spine. Hold __________ seconds.  Slowly return to the starting position. Repeat __________ times. Complete this exercise __________ times per day.  ROM - Flexion, Active-Assisted  Lie on your back. You may bend your knees for comfort.  Grasp a broomstick or cane so your hands are about shoulder-width apart. Your right / left hand should grip the end of the stick/cane so that your hand is positioned "thumbs-up," as if you were about to shake hands.  Using your healthy arm to lead, raise your right / left arm overhead until you feel a gentle stretch in your shoulder. Hold __________ seconds.  Use the stick/cane to assist in returning your right / left arm to its starting position. Repeat __________ times. Complete this exercise __________ times per day.  STRETCH - External Rotation and Abduction  Stagger your stance through a doorframe. It does not matter which foot is forward.  Choose one of the following positions as instructed by your physician, physical therapist or athletic trainer: place your hands:  and forearms above your head and on the door frame.  and forearms at head-height and on the door frame.  at elbow-height and on the door frame.  Keeping your head and chest upright and your stomach muscles tight to prevent over-extending your low-back, slowly shift your weight onto your front foot until you feel a stretch across  your chest and/or in the front of your shoulders.  Hold __________ seconds. Shift your weight to your back foot to release the stretch. Repeat __________ times. Complete this stretch __________ times per day.  STRENGTHENING EXERCISES  These exercises may help you when beginning to rehabilitate your injury. They may resolve your symptoms with or without further  involvement from your physician, physical therapist or athletic trainer. While completing these exercises, remember:   Muscles can gain both the endurance and the strength needed for everyday activities through controlled exercises.  Complete these exercises as instructed by your physician, physical therapist or athletic trainer. Progress with the resistance and repetition exercises only as your caregiver advises.  You may experience muscle soreness or fatigue, but the pain or discomfort you are trying to eliminate should never worsen during these exercises. If this pain does worsen, stop and make certain you are following the directions exactly. If the pain is still present after adjustments, discontinue the exercise until you can discuss the trouble with your clinician. STRENGTH - Scapular Depression and Adduction  With good posture, sit on a firm chair. Support your arms in front of you with pillows, arm rests or a table top. Have your elbows in line with the sides of your body.  Gently draw your shoulder blades down and toward your mid-back spine. Gradually increase the tension without tensing the muscles along the top of your shoulders and the back of your neck.  Hold for __________ seconds. Slowly release the tension and relax your muscles completely before completing the next repetition.  After you have practiced this exercise, remove the arm support and complete it while standing as well as sitting. Repeat __________ times. Complete this exercise __________ times per day.  STRENGTH - Shoulder Abductors, Isometric   With good posture, stand or sit about 4-6 inches from a wall with your right / left side facing the wall.  Bend your right / left elbow. Gently press your right / left elbow into the wall. Increase the pressure gradually until you are pressing as hard as you can without shrugging your shoulder or increasing any shoulder discomfort.  Hold __________ seconds.  Release the  tension slowly. Relax your shoulder muscles completely before you do the next repetition. Repeat __________ times. Complete this exercise __________ times per day.  STRENGTH - Shoulder Flexion, Isometric  With good posture and facing a wall, stand or sit about 4-6 inches away.  Keeping your right / left elbow straight, gently press the top of your fist into the wall. Increase the pressure gradually until you are pressing as hard as you can without shrugging your shoulder or increasing any shoulder discomfort.  Hold __________ seconds.  Release the tension slowly. Relax your shoulder muscles completely before you do the next repetition. Repeat __________ times. Complete this exercise __________ times per day.  STRENGTH - Internal Rotators  Secure a rubber exercise band/tubing to a fixed object so that it is at the same height as your right / left elbow when you are standing or sitting on a firm surface.  Stand or sit so that the secured exercise band/tubing is at your right / left side.  Bend your elbow 90 degrees. Place a folded towel or small pillow under your right / left arm so that your elbow is a few inches away from your side.  Keeping the tension on the exercise band/tubing, pull it across your body toward your abdomen. Be sure to keep your body steady so that  the movement is only coming from your shoulder rotating.  Hold __________ seconds. Release the tension in a controlled manner as you return to the starting position. Repeat __________ times. Complete this exercise __________ times per day.  STRENGTH - External Rotators  Secure a rubber exercise band/tubing to a fixed object so that it is at the same height as your right / left elbow when you are standing or sitting on a firm surface.  Stand or sit so that the secured exercise band/tubing is at your side that is not injured.  Bend your elbow 90 degrees. Place a folded towel or small pillow under your right / left arm so that  your elbow is a few inches away from your side.  Keeping the tension on the exercise band/tubing, pull it away from your body, as if pivoting on your elbow. Be sure to keep your body steady so that the movement is only coming from your shoulder rotating.  Hold __________ seconds. Release the tension in a controlled manner as you return to the starting position. Repeat __________ times. Complete this exercise __________ times per day.  STRENGTH - Shoulder Extensors  Secure a rubber exercise band/tubing so that it is at the height of your shoulders when you are either standing or sitting on a firm arm-less chair.  With a thumbs-up grip, grasp an end of the band/tubing in each hand. Straighten your elbows and lift your hands straight in front of you at shoulder height. Step back away from the secured end of band/tubing until it becomes tense.  Squeezing your shoulder blades together, pull your hands down to the sides of your thighs. Do not allow your hands to go behind you.  Hold for __________ seconds. Slowly ease the tension on the band/tubing as you reverse the directions and return to the starting position. Repeat __________ times. Complete this exercise __________ times per day.  STRENGTH - Shoulder Extensors, Prone  Lie on your stomach on a firm surface so that your right / left arm overhangs the edge. Rest your forehead on your opposite forearm. With your thumb facing away from your body and your elbow straight, hold a __________ weight in your hand.  Squeeze your right / left shoulder blade to your mid-back spine and then slowly raise your arm behind you to the height of the bed.  Hold for __________ seconds. Slowly reverse the directions and return to the starting position, controlling the weight as you lower your arm. Repeat __________ times. Complete this exercise __________ times per day.  STRENGTH - Horizontal Abductors Choose one of the two oppositions to complete this  exercise. Prone (lying on stomach):  Lie on your stomach on a firm surface so that your right / left arm overhangs the edge. Rest your forehead on your opposite forearm. With your palm facing the floor and your elbow straight, hold a __________ weight in your hand.  Squeeze your right / left shoulder blade to your mid-back spine and then slowly raise your arm to the height of the bed.  Hold for __________ seconds. Slowly reverse the directions and return to the starting position, controlling the weight as you lower your arm. Repeat __________ times. Complete this exercise __________ times per day. Standing:   Secure a rubber exercise band/tubing so that it is at the height of your shoulders when you are either standing or sitting on a firm arm-less chair.  Grasp an end of the band/tubing in each hand and have your palms face  each other. Straighten your elbows and lift your hands straight in front of you at shoulder height. Step back away from the secured end of band/tubing until it becomes tense.  Squeeze your shoulder blades together. Keeping your elbows locked and your hands at shoulder-height, bring your hands out to your side.  Hold __________ seconds. Slowly ease the tension on the band/tubing as you reverse the directions and return to the starting position. Repeat __________ times. Complete this exercise __________ times per day. STRENGTH - Scapular Retractors and Elevators  Secure a rubber exercise band/tubing so that it is at the height of your shoulders when you are either standing or sitting on a firm arm-less chair.  With a thumbs-up grip, grasp an end of the band/tubing in each hand. Step back away from the secured end of band/tubing until it becomes tense.  Squeezing your shoulder blades together, straighten your elbows and lift your hands straight over your head.  Hold for __________ seconds. Slowly ease the tension on the band/tubing as you reverse the directions and return  to the starting position. Repeat __________ times. Complete this exercise __________ times per day.    This information is not intended to replace advice given to you by your health care provider. Make sure you discuss any questions you have with your health care provider.   Document Released: 10/30/2005 Document Revised: 03/16/2015 Document Reviewed: 02/11/2009 Else[employer] Interactive Patient Education [employer]

## 2015-12-09 ENCOUNTER — Ambulatory Visit (INDEPENDENT_AMBULATORY_CARE_PROVIDER_SITE_OTHER): Payer: Commercial Managed Care - HMO | Admitting: Family Medicine

## 2015-12-09 ENCOUNTER — Encounter: Payer: Self-pay | Admitting: Family Medicine

## 2015-12-09 VITALS — BP 111/62 | HR 91 | Temp 97.9°F | Ht 65.5 in | Wt 187.0 lb

## 2015-12-09 DIAGNOSIS — R21 Rash and other nonspecific skin eruption: Secondary | ICD-10-CM

## 2015-12-09 NOTE — Progress Notes (Signed)
BP 111/62 mmHg  Pulse 91  Temp(Src) 97.9 F (36.6 C)  Ht 5' 5.5" (1.664 m)  Wt 187 lb (84.823 kg)  BMI 30.63 kg/m2  SpO2 100%  LMP 12/04/2015 (Exact Date)   Subjective:    Patient ID: Shelley Aguirre, female    DOB: [birth year], 25 y.o.   MRN: [id number]  HPI: Shelley Aguirre is a 25 y.o. female  Chief Complaint  Patient presents with  . skin irritation    Patient states that it started as a small blister, now it has popped. She wants to make sure it is not something contagious   RASH Duration:  Couple of days  Location: arms  Itching: no Burning: no Redness: yes Oozing: no Scaling: yes Blisters: yes Painful: no Fevers: no Change in detergents/soaps/personal care products: no Recent illness: no Recent travel:no History of same: no Context: better Alleviating factors: nothing Treatments attempted:nothing Shortness of breath: no  Throat/tongue swelling: no Myalgias/arthralgias: no  Relevant past medical, surgical, family and social history reviewed and updated as indicated. Interim medical history since our last visit reviewed. Allergies and medications reviewed and updated.  Review of Systems  Constitutional: Negative.   Respiratory: Negative.   Cardiovascular: Negative.   Musculoskeletal: Negative.   Skin: Positive for rash. Negative for color change, pallor and wound.  Psychiatric/Behavioral: Negative.     Per HPI unless specifically indicated above     Objective:    BP 111/62 mmHg  Pulse 91  Temp(Src) 97.9 F (36.6 C)  Ht 5' 5.5" (1.664 m)  Wt 187 lb (84.823 kg)  BMI 30.63 kg/m2  SpO2 100%  LMP 12/04/2015 (Exact Date)  Wt Readings from Last 3 Encounters:  12/09/15 187 lb (84.823 kg)  12/06/15 188 lb (85.276 kg)  12/01/15 187 lb (84.823 kg)    Physical Exam  Constitutional: She is oriented to person, place, and time. She appears well-developed and well-nourished. No distress.  HENT:  Head: Normocephalic and atraumatic.  Right Ear: Hearing  normal.  Left Ear: Hearing normal.  Nose: Nose normal.  Eyes: Conjunctivae and lids are normal. Right eye exhibits no discharge. Left eye exhibits no discharge. No scleral icterus.  Pulmonary/Chest: Effort normal. No respiratory distress.  Musculoskeletal: Normal range of motion.  Neurological: She is alert and oriented to person, place, and time.  Skin: Skin is warm, dry and intact. No rash noted. No erythema. No pallor.  2cmx 0.5cm erythematous lesion on R inner forearm, small vesicles on proximal portion  Psychiatric: She has a normal mood and affect. Her speech is normal and behavior is normal. Judgment and thought content normal. Cognition and memory are normal.  Nursing note and vitals reviewed.   Results for orders placed or performed in visit on 12/01/15  Comprehensive metabolic panel  Result Value Ref Range   Glucose 87 65 - 99 mg/dL   BUN 9 6 - 20 mg/dL   Creatinine, Ser 4.09 0.57 - 1.00 mg/dL   GFR calc non Af Amer 124 >59 mL/min/1.73   GFR calc Af Amer 143 >59 mL/min/1.73   BUN/Creatinine Ratio 14 8 - 20   Sodium 139 134 - 144 mmol/L   Potassium 3.8 3.5 - 5.2 mmol/L   Chloride 103 96 - 106 mmol/L   CO2 18 18 - 29 mmol/L   Calcium 8.6 (L) 8.7 - 10.2 mg/dL   Total Protein 6.6 6.0 - 8.5 g/dL   Albumin 4.4 3.5 - 5.5 g/dL   Globulin, Total 2.2 1.5 - 4.5 g/dL  Albumin/Globulin Ratio 2.0 1.1 - 2.5   Bilirubin Total <0.2 0.0 - 1.2 mg/dL   Alkaline Phosphatase 80 39 - 117 IU/L   AST 52 (H) 0 - 40 IU/L   ALT 62 (H) 0 - 32 IU/L  Amylase  Result Value Ref Range   Amylase 16 (L) 31 - 124 U/L  Lipase  Result Value Ref Range   Lipase 30 0 - 59 U/L  CBC With Differential/Platelet  Result Value Ref Range   WBC 5.0 3.4 - 10.8 x10E3/uL   RBC 4.21 3.77 - 5.28 x10E6/uL   Hemoglobin 12.6 11.1 - 15.9 g/dL   Hematocrit 16.1 09.6 - 46.6 %   MCV 88 79 - 97 fL   MCH 29.9 26.6 - 33.0 pg   MCHC 34.1 31.5 - 35.7 g/dL   RDW 04.5 40.9 - 81.1 %   Platelets 170 150 - 379 x10E3/uL    Neutrophils 45 %   Lymphs 47 %   MID 8 %   Neutrophils Absolute 2.2 1.4 - 7.0 x10E3/uL   Lymphocytes Absolute 2.4 0.7 - 3.1 x10E3/uL   MID (Absolute) 0.4 0.1 - 1.6 X10E3/uL      Assessment & Plan:   Problem List Items Addressed This Visit    None    Visit Diagnoses    Rash    -  Primary    ?Chemical burn vs small herpes outbreak. Improving. No need for medication. Keep covered. Triple antibiotic. If not getting better or getting worse, let us know        Follow up plan: Return if symptoms worsen or fail to improve.

## 2015-12-14 IMAGING — CT CT HEAD WITHOUT CONTRAST
1 series · 16 of 30 positions shown, 20 images · non-contrast
Comparison: None.

CLINICAL DATA: Patient was hit in the face with a softball
yesterday seen here for lacaration repair. Now having left sided
head numbness.

EXAM:
CT HEAD WITHOUT CONTRAST
TECHNIQUE: Contiguous axial images were obtained from the base of the skull
through the vertex without intravenous contrast.

[Series 2: head wo · axial · 0.43mm/px · z∈[-123,+3]mm · 16 of 32 slices shown, 20 images]
[im 2/32  brain]
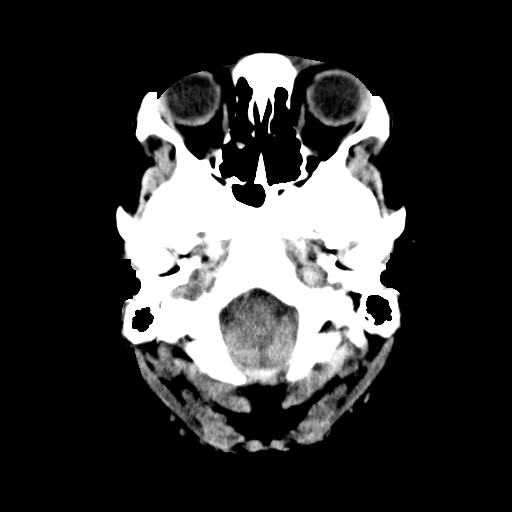
[im 2/32  bone]
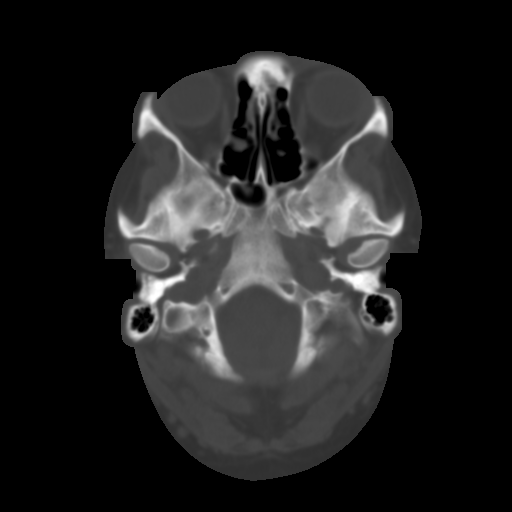
[im 4/32  brain]
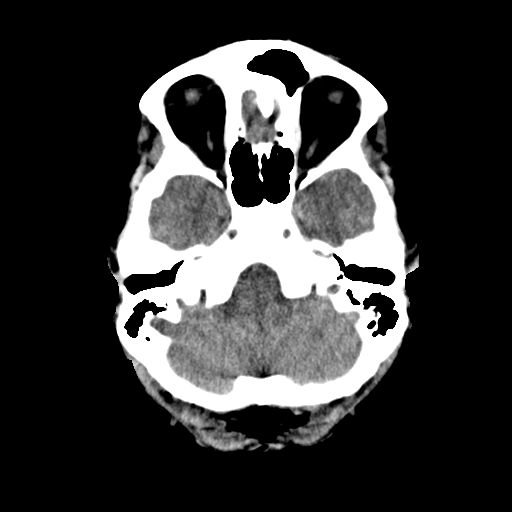
[im 6/32  brain]
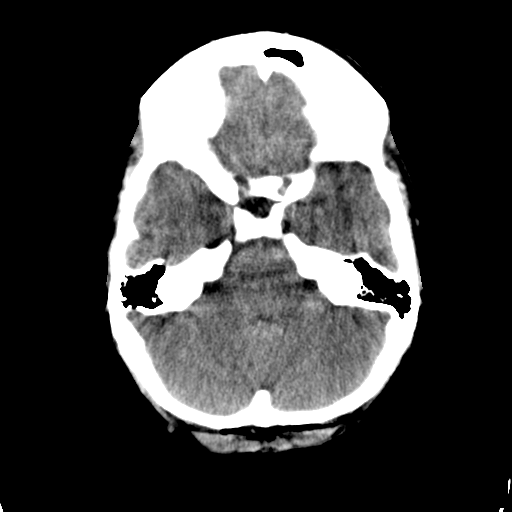
[im 8/32  brain]
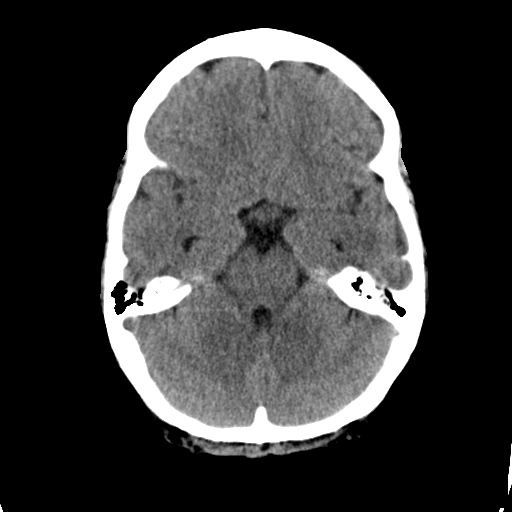
[im 9/32  brain]
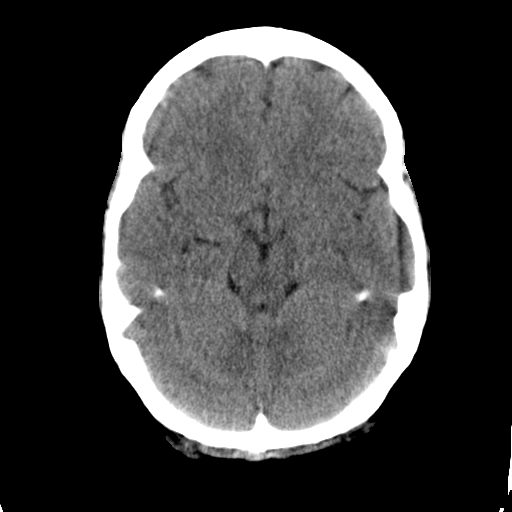
[im 9/32  bone]
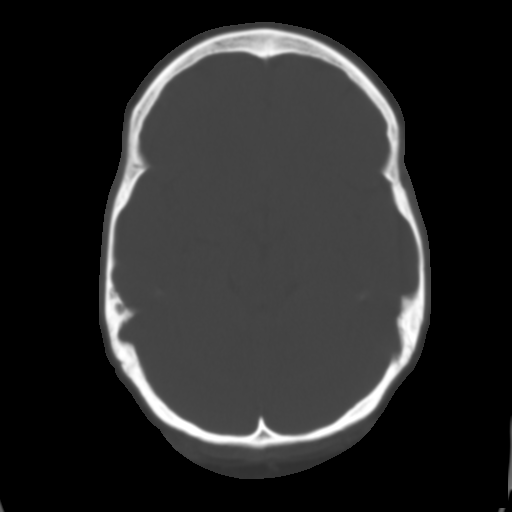
[im 11/32  brain]
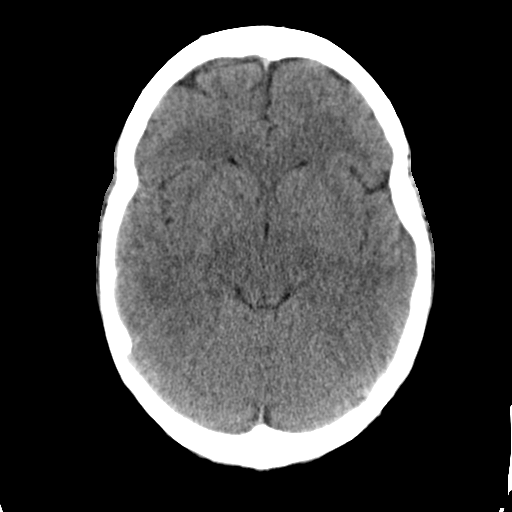
[im 13/32  brain]
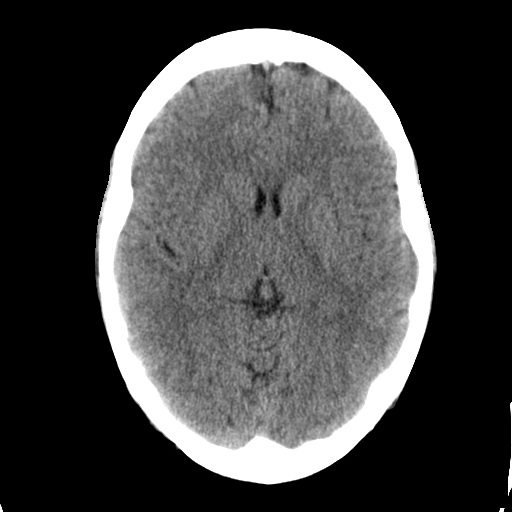
[im 15/32  brain]
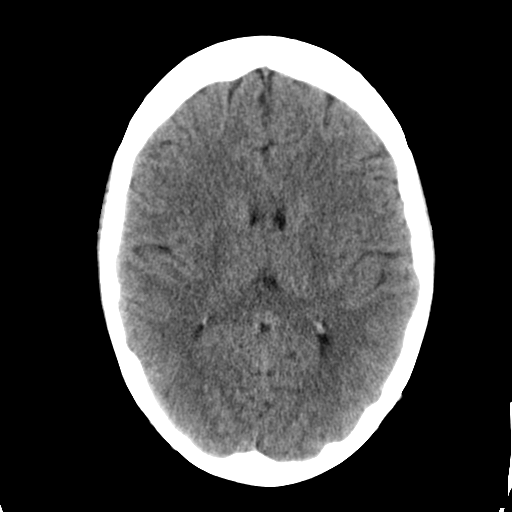
[im 17/32  brain]
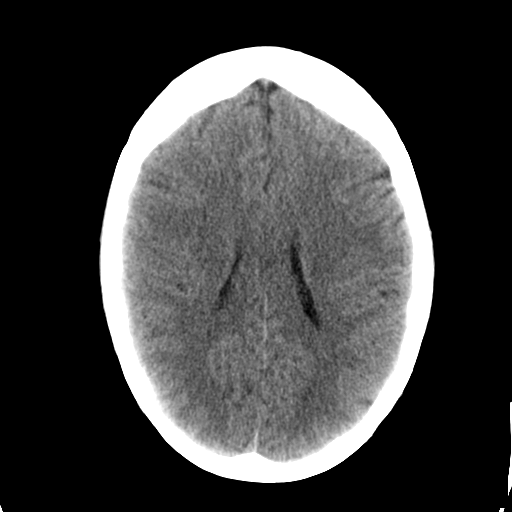
[im 17/32  bone]
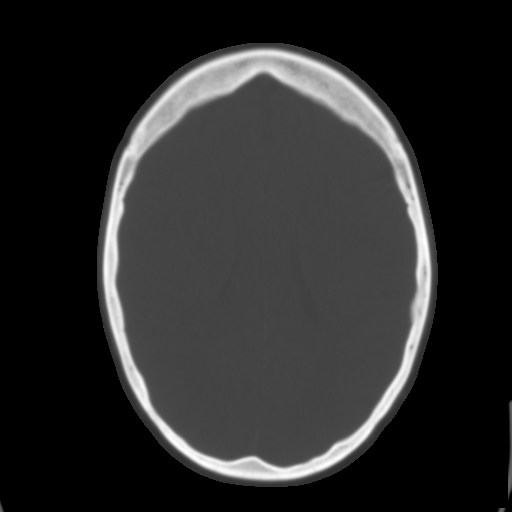
[im 19/32  brain]
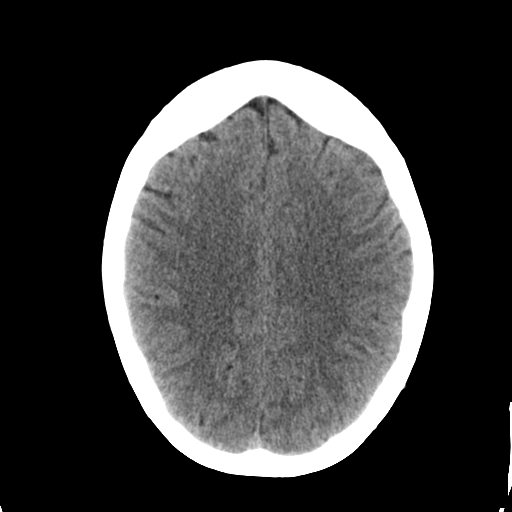
[im 21/32  brain]
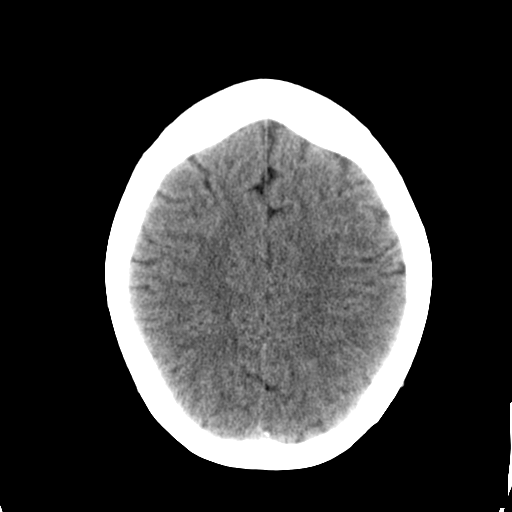
[im 23/32  brain]
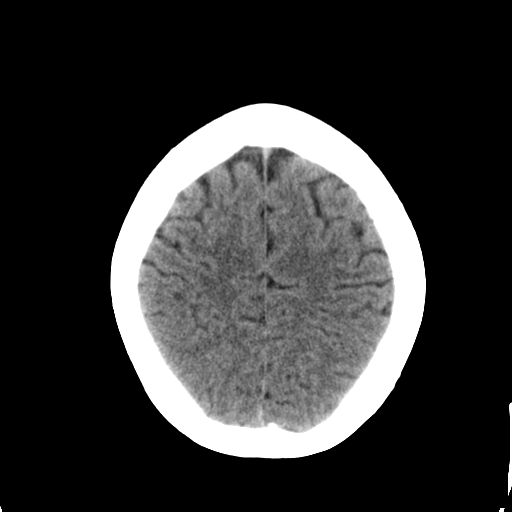
[im 24/32  brain]
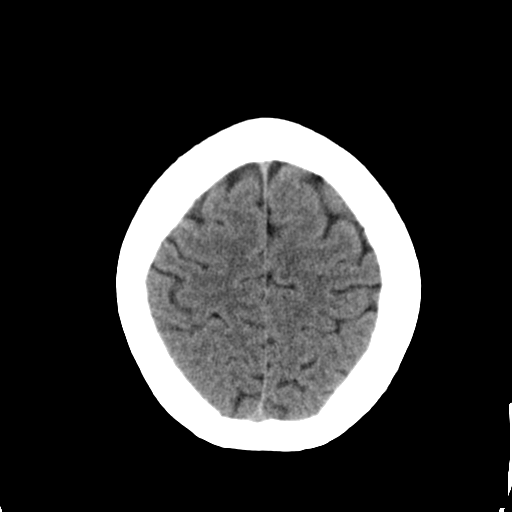
[im 24/32  bone]
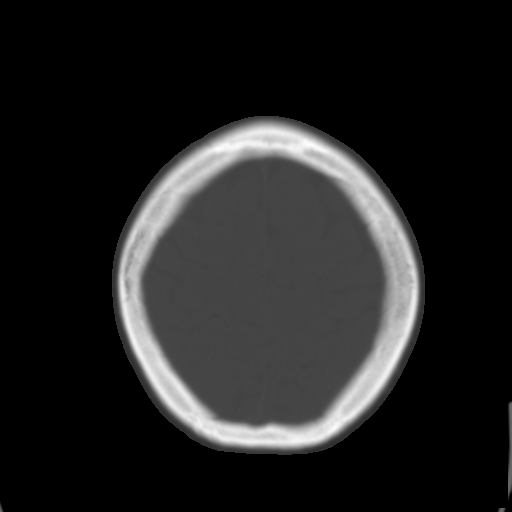
[im 26/32  brain]
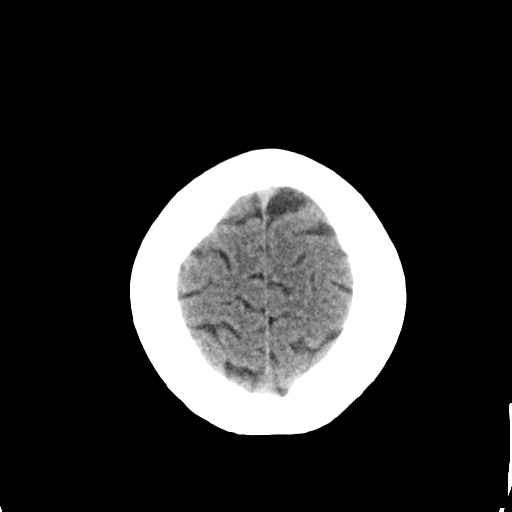
[im 28/32  brain]
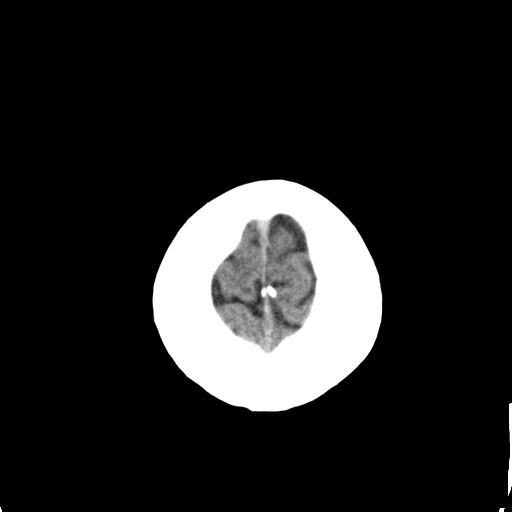
[im 30/32  brain]
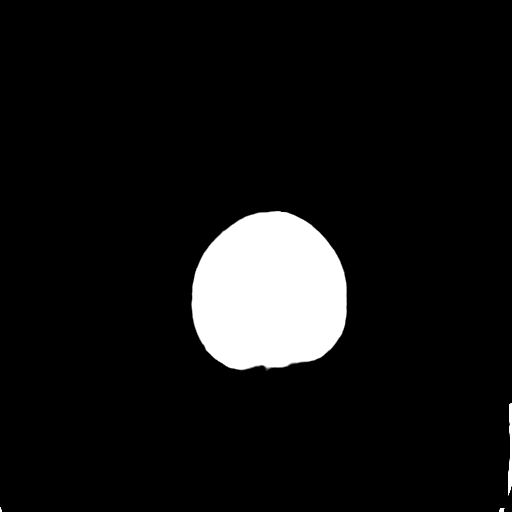

[16 of 30 positions shown; findings below may reference images not displayed]

FINDINGS: There is no evidence of mass effect, midline shift or extra-axial
fluid collections. There is no evidence of a space-occupying lesion
or intracranial hemorrhage. There is no evidence of a cortical-based
area of acute infarction.

The ventricles and sulci are appropriate for the patient's age. The
basal cisterns are patent.

Visualized portions of the orbits are unremarkable. Mild mucosal
thickening in the ethmoid sinuses.

The osseous structures are unremarkable.
IMPRESSION: No acute intracranial pathology.

## 2015-12-20 ENCOUNTER — Ambulatory Visit (INDEPENDENT_AMBULATORY_CARE_PROVIDER_SITE_OTHER): Payer: Commercial Managed Care - HMO | Admitting: Family Medicine

## 2015-12-20 ENCOUNTER — Encounter: Payer: Self-pay | Admitting: Family Medicine

## 2015-12-20 ENCOUNTER — Telehealth: Payer: Self-pay | Admitting: Family Medicine

## 2015-12-20 VITALS — BP 108/71 | HR 88 | Temp 98.1°F | Ht 64.5 in | Wt 178.0 lb

## 2015-12-20 DIAGNOSIS — J029 Acute pharyngitis, unspecified: Secondary | ICD-10-CM

## 2015-12-20 DIAGNOSIS — J40 Bronchitis, not specified as acute or chronic: Secondary | ICD-10-CM | POA: Diagnosis not present

## 2015-12-20 DIAGNOSIS — R062 Wheezing: Secondary | ICD-10-CM | POA: Diagnosis not present

## 2015-12-20 MED ORDER — PREDNISONE 10 MG PO TABS: ORAL_TABLET | ORAL | Status: DC

## 2015-12-20 MED ORDER — AZITHROMYCIN 250 MG PO TABS: ORAL_TABLET | ORAL | Status: DC

## 2015-12-20 MED ORDER — BENZONATATE 200 MG PO CAPS: 200.0000 mg | ORAL_CAPSULE | Freq: Three times a day (TID) | ORAL | Status: DC | PRN

## 2015-12-20 MED ORDER — ALBUTEROL SULFATE (2.5 MG/3ML) 0.083% IN NEBU: 2.5000 mg | INHALATION_SOLUTION | Freq: Once | RESPIRATORY_TRACT | Status: AC

## 2015-12-20 NOTE — Telephone Encounter (Signed)
Patient needs a doctor note. She will be back in about 30 min to pick it up.

## 2015-12-20 NOTE — Addendum Note (Signed)
Addended by: Dorcas Carrow on: 12/20/2015 01:47 PM   Modules accepted: Kipp Brood

## 2015-12-20 NOTE — Progress Notes (Signed)
BP 108/71 mmHg  Pulse 88  Temp(Src) 98.1 F (36.7 C)  Ht 5' 4.5" (1.638 m)  Wt 178 lb (80.74 kg)  BMI 30.09 kg/m2  SpO2 98%  LMP 12/04/2015 (Exact Date)   Subjective:    Patient ID: Shelley Aguirre, female    DOB: 18-Jun-1991, 25 y.o.   MRN: [id number]  HPI: Shelley Aguirre is a 25 y.o. female  Chief Complaint  Patient presents with  . URI    X 3 days, sore throat, cough, nasal and chest congestion and chills.   UPPER RESPIRATORY TRACT INFECTION Duration: 3 days Worst symptom: sore throat, coughing Fever: no Cough: yes Shortness of breath: yes Wheezing: yes Chest pain: yes, with cough Chest tightness: yes Chest congestion: yes Nasal congestion: yes Runny nose: yes Post nasal drip: yes Sneezing: no Sore throat: yes Swollen glands: yes Sinus pressure: no Headache: yes Face pain: no Toothache: no Ear pain: no  Ear pressure: yes bilateral Eyes red/itching:no Eye drainage/crusting: no  Vomiting: no Rash: no Fatigue: yes Sick contacts: yes Strep contacts: no  Context: worse Recurrent sinusitis: no Relief with OTC cold/cough medications: yes  Treatments attempted: cold/sinus   Relevant past medical, surgical, family and social history reviewed and updated as indicated. Interim medical history since our last visit reviewed. Allergies and medications reviewed and updated.  Review of Systems  Constitutional: Positive for chills and fatigue. Negative for fever, diaphoresis, activity change, appetite change and unexpected weight change.  HENT: Positive for congestion, postnasal drip, rhinorrhea, sinus pressure, sneezing, sore throat and tinnitus. Negative for dental problem, drooling, ear discharge, ear pain, facial swelling, hearing loss, mouth sores, trouble swallowing and voice change.   Respiratory: Positive for cough, chest tightness, shortness of breath and wheezing. Negative for apnea, choking and stridor.   Cardiovascular: Negative.  Negative for chest pain,  palpitations and leg swelling.  Psychiatric/Behavioral: Negative.     Per HPI unless specifically indicated above     Objective:    BP 108/71 mmHg  Pulse 88  Temp(Src) 98.1 F (36.7 C)  Ht 5' 4.5" (1.638 m)  Wt 178 lb (80.74 kg)  BMI 30.09 kg/m2  SpO2 98%  LMP 12/04/2015 (Exact Date)  Wt Readings from Last 3 Encounters:  12/20/15 178 lb (80.74 kg)  12/09/15 187 lb (84.823 kg)  12/06/15 188 lb (85.276 kg)    Physical Exam  Constitutional: She is oriented to person, place, and time. She appears well-developed and well-nourished. No distress.  HENT:  Head: Normocephalic and atraumatic.  Right Ear: Hearing, tympanic membrane, external ear and ear canal normal.  Left Ear: Hearing, tympanic membrane, external ear and ear canal normal.  Nose: Mucosal edema and rhinorrhea present. No nose lacerations, sinus tenderness, nasal deformity, septal deviation or nasal septal hematoma. No epistaxis.  No foreign bodies. Right sinus exhibits no maxillary sinus tenderness and no frontal sinus tenderness. Left sinus exhibits no maxillary sinus tenderness and no frontal sinus tenderness.  Mouth/Throat: Uvula is midline and mucous membranes are normal. Posterior oropharyngeal edema and posterior oropharyngeal erythema present. No oropharyngeal exudate or tonsillar abscesses.  Eyes: Conjunctivae and lids are normal. Right eye exhibits no discharge. Left eye exhibits no discharge. No scleral icterus.  Pulmonary/Chest: Effort normal. No respiratory distress. She has no decreased breath sounds. She has wheezes in the right upper field, the right middle field, the right lower field, the left upper field, the left middle field and the left lower field. She has no rhonchi. She has no rales.  Musculoskeletal:  Normal range of motion.  Neurological: She is alert and oriented to person, place, and time.  Skin: Skin is intact. No rash noted. She is not diaphoretic.  Psychiatric: She has a normal mood and affect.  Her speech is normal and behavior is normal. Judgment and thought content normal. Cognition and memory are normal.  Nursing note and vitals reviewed.   Results for orders placed or performed in visit on 12/01/15  Comprehensive metabolic panel  Result Value Ref Range   Glucose 87 65 - 99 mg/dL   BUN 9 6 - 20 mg/dL   Creatinine, Ser 9.14 0.57 - 1.00 mg/dL   GFR calc non Af Amer 124 >59 mL/min/1.73   GFR calc Af Amer 143 >59 mL/min/1.73   BUN/Creatinine Ratio 14 8 - 20   Sodium 139 134 - 144 mmol/L   Potassium 3.8 3.5 - 5.2 mmol/L   Chloride 103 96 - 106 mmol/L   CO2 18 18 - 29 mmol/L   Calcium 8.6 (L) 8.7 - 10.2 mg/dL   Total Protein 6.6 6.0 - 8.5 g/dL   Albumin 4.4 3.5 - 5.5 g/dL   Globulin, Total 2.2 1.5 - 4.5 g/dL   Albumin/Globulin Ratio 2.0 1.1 - 2.5   Bilirubin Total <0.2 0.0 - 1.2 mg/dL   Alkaline Phosphatase 80 39 - 117 IU/L   AST 52 (H) 0 - 40 IU/L   ALT 62 (H) 0 - 32 IU/L  Amylase  Result Value Ref Range   Amylase 16 (L) 31 - 124 U/L  Lipase  Result Value Ref Range   Lipase 30 0 - 59 U/L  CBC With Differential/Platelet  Result Value Ref Range   WBC 5.0 3.4 - 10.8 x10E3/uL   RBC 4.21 3.77 - 5.28 x10E6/uL   Hemoglobin 12.6 11.1 - 15.9 g/dL   Hematocrit 78.2 95.6 - 46.6 %   MCV 88 79 - 97 fL   MCH 29.9 26.6 - 33.0 pg   MCHC 34.1 31.5 - 35.7 g/dL   RDW 21.3 08.6 - 57.8 %   Platelets 170 150 - 379 x10E3/uL   Neutrophils 45 %   Lymphs 47 %   MID 8 %   Neutrophils Absolute 2.2 1.4 - 7.0 x10E3/uL   Lymphocytes Absolute 2.4 0.7 - 3.1 x10E3/uL   MID (Absolute) 0.4 0.1 - 1.6 X10E3/uL      Assessment & Plan:   Problem List Items Addressed This Visit    None    Visit Diagnoses    Bronchitis    -  Primary    Only slightly better on recheck. Will treat with prednisone, tessalon and azithromycin. Continue to monitor. Recheck 2 weeks. Call if not getting better.     Sore throat        With erythema and swelling, will check strep. Strep negative.     Relevant Orders     Rapid strep screen (not at [facility])    Wheezing        Will give neb today and recheck.    Relevant Medications    albuterol (PROVENTIL) (2.5 MG/3ML) 0.083% nebulizer solution 2.5 mg        Follow up plan: Return in about 2 weeks (around 01/03/2016) for OMM/Lung recheck.

## 2015-12-22 LAB — CULTURE, GROUP A STREP: STREP A CULTURE: NEGATIVE

## 2015-12-22 LAB — RAPID STREP SCREEN (MED CTR MEBANE ONLY): Strep Gp A Ag, IA W/Reflex: NEGATIVE

## 2015-12-22 NOTE — Telephone Encounter (Signed)
Pt called and stated that her throat is hurting really bad and she is also states she is having a hard time breathing. Pt would like a call back.

## 2015-12-23 NOTE — Telephone Encounter (Signed)
Spoke with patient, told her that we do not have any appointments, I told her to go to Urgent Care to make sure that she does not have the flu.

## 2015-12-23 NOTE — Telephone Encounter (Signed)
Called and left a voicemail for patient to return my call. 

## 2015-12-29 ENCOUNTER — Encounter: Payer: Self-pay | Admitting: Family Medicine

## 2015-12-29 ENCOUNTER — Ambulatory Visit (INDEPENDENT_AMBULATORY_CARE_PROVIDER_SITE_OTHER): Payer: Commercial Managed Care - HMO | Admitting: Family Medicine

## 2015-12-29 VITALS — BP 92/64 | HR 93 | Temp 98.8°F | Ht 65.0 in | Wt 175.0 lb

## 2015-12-29 DIAGNOSIS — R5382 Chronic fatigue, unspecified: Secondary | ICD-10-CM

## 2015-12-29 DIAGNOSIS — R42 Dizziness and giddiness: Secondary | ICD-10-CM | POA: Diagnosis not present

## 2015-12-29 DIAGNOSIS — N921 Excessive and frequent menstruation with irregular cycle: Secondary | ICD-10-CM

## 2015-12-29 LAB — CBC WITH DIFFERENTIAL/PLATELET
HEMATOCRIT: 37.5 % (ref 34.0–46.6)
HEMOGLOBIN: 12.8 g/dL (ref 11.1–15.9)
LYMPHS ABS: 3.2 10*3/uL — AB (ref 0.7–3.1)
LYMPHS: 54 %
MCH: 30 pg (ref 26.6–33.0)
MCHC: 34.1 g/dL (ref 31.5–35.7)
MCV: 88 fL (ref 79–97)
MID (ABSOLUTE): 0.6 10*3/uL (ref 0.1–1.6)
MID: 11 %
Neutrophils Absolute: 2.1 10*3/uL (ref 1.4–7.0)
Neutrophils: 36 %
Platelets: 175 10*3/uL (ref 150–379)
RBC: 4.26 x10E6/uL (ref 3.77–5.28)
RDW: 13.9 % (ref 12.3–15.4)
WBC: 5.9 10*3/uL (ref 3.4–10.8)

## 2015-12-29 MED ORDER — FERROUS SULFATE 325 (65 FE) MG PO TABS: 325.0000 mg | ORAL_TABLET | Freq: Every day | ORAL | Status: AC

## 2015-12-29 NOTE — Progress Notes (Signed)
BP 92/64 mmHg  Pulse 93  Temp(Src) 98.8 F (37.1 C)  Ht  (1.651 m)  Wt 175 lb (79.379 kg)  BMI 29.12 kg/m2  SpO2 100%  LMP 12/04/2015 (Exact Date)   Subjective:    Patient ID: Shelley Aguirre, female    DOB: 1991/10/05, 25 y.o.   MRN: [id number]  HPI: Shelley Aguirre is a 25 y.o. female  Chief Complaint  Patient presents with  . Gastroesophageal Reflux    Patinet would like a reill on Prilosec  . Menorrhagia   ?? ANEMIA- Got her nexaplanon in about 2 months ago. Has had pretty heavy periods for about that whole time. Has not had any days without bleeding. Has been feeling dizzy and tired when she stands up. Anemia status: uncontrolled Fatigue: yes Decreased exercise tolerance: yes  Dyspnea on exertion: no Palpitations: no Bleeding: yes Pica: yes   GERD GERD control status: better  Satisfied with current treatment? yes Heartburn frequency: never Medication side effects: no  Medication compliance: excellent Dysphagia: no Odynophagia:  no Hematemesis: no Blood in stool: no EGD: no  Relevant past medical, surgical, family and social history reviewed and updated as indicated. Interim medical history since our last visit reviewed. Allergies and medications reviewed and updated.  Review of Systems  Constitutional: Positive for fatigue. Negative for fever, chills, diaphoresis, activity change, appetite change and unexpected weight change.  HENT: Negative.   Respiratory: Negative.   Gastrointestinal: Negative.   Allergic/Immunologic: Negative.     Per HPI unless specifically indicated above     Objective:    BP 92/64 mmHg  Pulse 93  Temp(Src) 98.8 F (37.1 C)  Ht  (1.651 m)  Wt 175 lb (79.379 kg)  BMI 29.12 kg/m2  SpO2 100%  LMP 12/04/2015 (Exact Date)  Wt Readings from Last 3 Encounters:  12/29/15 175 lb (79.379 kg)  12/20/15 178 lb (80.74 kg)  12/09/15 187 lb (84.823 kg)    Physical Exam  Constitutional: She is oriented to person, place, and  time. She appears well-developed and well-nourished. No distress.  HENT:  Head: Normocephalic and atraumatic.  Right Ear: Hearing normal.  Left Ear: Hearing normal.  Nose: Nose normal.  Eyes: Conjunctivae and lids are normal. Right eye exhibits no discharge. Left eye exhibits no discharge. No scleral icterus.  Cardiovascular: Regular rhythm, normal heart sounds and intact distal pulses.  Tachycardia present.  Exam reveals no gallop and no friction rub.   No murmur heard. Pulmonary/Chest: Effort normal and breath sounds normal. No respiratory distress. She has no wheezes. She has no rales. She exhibits no tenderness.  Musculoskeletal: Normal range of motion.  Neurological: She is alert and oriented to person, place, and time.  Skin: Skin is warm, dry and intact. No rash noted. No erythema. There is pallor.  Psychiatric: She has a normal mood and affect. Her speech is normal and behavior is normal. Judgment and thought content normal. Cognition and memory are normal.  Nursing note and vitals reviewed.   Results for orders placed or performed in visit on 12/29/15  CBC With Differential/Platelet  Result Value Ref Range   WBC 5.9 3.4 - 10.8 x10E3/uL   RBC 4.26 3.77 - 5.28 x10E6/uL   Hemoglobin 12.8 11.1 - 15.9 g/dL   Hematocrit 36.6 44.0 - 46.6 %   MCV 88 79 - 97 fL   MCH 30.0 26.6 - 33.0 pg   MCHC 34.1 31.5 - 35.7 g/dL   RDW 34.7 42.5 - 95.6 %  Platelets 175 150 - 379 x10E3/uL   Neutrophils 36 %   Lymphs 54 %   MID 11 %   Neutrophils Absolute 2.1 1.4 - 7.0 x10E3/uL   Lymphocytes Absolute 3.2 (H) 0.7 - 3.1 x10E3/uL   MID (Absolute) 0.6 0.1 - 1.6 X10E3/uL      Assessment & Plan:   Problem List Items Addressed This Visit    None    Visit Diagnoses    Menorrhagia with irregular cycle    -  Primary    Encouraged patient to follow up with GYN. She will call today to see if she can get in.     Relevant Orders    CBC With Differential/Platelet (Completed)    Iron and TIBC    B12     Folate    Ferritin    Dizziness        Possibly due to blood loss, which seems compensated. Will treat with iron supplement and see how she does.    Relevant Orders    CBC With Differential/Platelet (Completed)    Iron and TIBC    B12    Folate    Ferritin    Chronic fatigue        Likely compensated iron deficiency. Will start iron. Await iron studies. Call GYN. Continue to monitor closely.    Relevant Orders    CBC With Differential/Platelet (Completed)    Iron and TIBC    B12    Folate    Ferritin        Follow up plan: Return ASAP, for back pain.

## 2015-12-30 LAB — VITAMIN B12: VITAMIN B 12: 442 pg/mL (ref 211–946)

## 2015-12-30 LAB — IRON AND TIBC
IRON SATURATION: 21 % (ref 15–55)
Iron: 66 ug/dL (ref 27–159)
TIBC: 309 ug/dL (ref 250–450)
UIBC: 243 ug/dL (ref 131–425)

## 2015-12-30 LAB — FOLATE: Folate: 3.5 ng/mL (ref >3.0)

## 2015-12-30 LAB — FERRITIN: Ferritin: 39 ng/mL (ref 15–150)

## 2015-12-31 ENCOUNTER — Telehealth: Payer: Self-pay | Admitting: Family Medicine

## 2015-12-31 NOTE — Telephone Encounter (Signed)
Spoke with patient, told her to go to Urgent Care about the cyst.

## 2015-12-31 NOTE — Telephone Encounter (Signed)
Pt called stated that she would like a call back regarding the cyst on her butt, stated it is becoming more painful, hurts to sit. Also, has questions about iron levels from lab work. Please call ASAP. Thanks.

## 2016-01-01 ENCOUNTER — Encounter: Payer: Self-pay | Admitting: Emergency Medicine

## 2016-01-01 DIAGNOSIS — L0501 Pilonidal cyst with abscess: Secondary | ICD-10-CM | POA: Diagnosis not present

## 2016-01-01 DIAGNOSIS — L03317 Cellulitis of buttock: Secondary | ICD-10-CM | POA: Insufficient documentation

## 2016-01-01 DIAGNOSIS — F17210 Nicotine dependence, cigarettes, uncomplicated: Secondary | ICD-10-CM | POA: Diagnosis not present

## 2016-01-01 DIAGNOSIS — Z79899 Other long term (current) drug therapy: Secondary | ICD-10-CM | POA: Diagnosis not present

## 2016-01-01 DIAGNOSIS — L729 Follicular cyst of the skin and subcutaneous tissue, unspecified: Secondary | ICD-10-CM | POA: Diagnosis present

## 2016-01-01 DIAGNOSIS — Z880 Allergy status to penicillin: Secondary | ICD-10-CM | POA: Insufficient documentation

## 2016-01-01 NOTE — ED Notes (Signed)
Pt with c/o pain and swelling above her buttock; says history of cyst to same area; was seen by her MD this past week for other issues; says they assessed and said no infection; pt says area has increased in size and become more painful

## 2016-01-02 ENCOUNTER — Emergency Department
Admission: EM | Admit: 2016-01-02 | Discharge: 2016-01-02 | Disposition: A | Discharge status: Home / Self Care | Payer: Commercial Managed Care - HMO | Attending: Emergency Medicine | Admitting: Emergency Medicine

## 2016-01-02 DIAGNOSIS — L0501 Pilonidal cyst with abscess: Secondary | ICD-10-CM

## 2016-01-02 DIAGNOSIS — L03317 Cellulitis of buttock: Secondary | ICD-10-CM

## 2016-01-02 MED ORDER — LIDOCAINE-PRILOCAINE 2.5-2.5 % EX CREA
TOPICAL_CREAM | Freq: Once | CUTANEOUS | Status: AC
  Administered 2016-01-02: 1 via TOPICAL
  Filled 2016-01-02: qty 5

## 2016-01-02 MED ORDER — OXYCODONE-ACETAMINOPHEN 5-325 MG PO TABS: 1.0000 | ORAL_TABLET | Freq: Four times a day (QID) | ORAL | Status: AC | PRN

## 2016-01-02 MED ORDER — CLINDAMYCIN HCL 300 MG PO CAPS: 300.0000 mg | ORAL_CAPSULE | Freq: Three times a day (TID) | ORAL | Status: AC

## 2016-01-02 MED ORDER — CLINDAMYCIN HCL 150 MG PO CAPS
300.0000 mg | ORAL_CAPSULE | Freq: Once | ORAL | Status: AC
  Administered 2016-01-02: 300 mg via ORAL
  Filled 2016-01-02: qty 2

## 2016-01-02 NOTE — Discharge Instructions (Signed)
Abscess °An abscess is an infected area that contains a collection of pus and debris. It can occur in almost any part of the body. An abscess is also known as a furuncle or boil. °CAUSES  °An abscess occurs when tissue gets infected. This can occur from blockage of oil or sweat glands, infection of hair follicles, or a minor injury to the skin. As the body tries to fight the infection, pus collects in the area and creates pressure under the skin. This pressure causes pain. People with weakened immune systems have difficulty fighting infections and get certain abscesses more often.  °SYMPTOMS °Usually an abscess develops on the skin and becomes a painful mass that is red, warm, and tender. If the abscess forms under the skin, you may feel a moveable soft area under the skin. Some abscesses break open (rupture) on their own, but most will continue to get worse without care. The infection can spread deeper into the body and eventually into the bloodstream, causing you to feel ill.  °DIAGNOSIS  °Your caregiver will take your medical history and perform a physical exam. A sample of fluid may also be taken from the abscess to determine what is causing your infection. °TREATMENT  °Your caregiver may prescribe antibiotic medicines to fight the infection. However, taking antibiotics alone usually does not cure an abscess. Your caregiver may need to make a small cut (incision) in the abscess to drain the pus. In some cases, gauze is packed into the abscess to reduce pain and to continue draining the area. °HOME CARE INSTRUCTIONS  °· Only take over-the-counter or prescription medicines for pain, discomfort, or fever as directed by your caregiver. °· If you were prescribed antibiotics, take them as directed. Finish them even if you start to feel better. °· If gauze is used, follow your caregiver's directions for changing the gauze. °· To avoid spreading the infection: °· Keep your draining abscess covered with a  bandage. °· Wash your hands well. °· Do not share personal care items, towels, or whirlpools with others. °· Avoid skin contact with others. °· Keep your skin and clothes clean around the abscess. °· Keep all follow-up appointments as directed by your caregiver. °SEEK MEDICAL CARE IF:  °· You have increased pain, swelling, redness, fluid drainage, or bleeding. °· You have muscle aches, chills, or a general ill feeling. °· You have a fever. °MAKE SURE YOU:  °· Understand these instructions. °· Will watch your condition. °· Will get help right away if you are not doing well or get worse. °  °This information is not intended to replace advice given to you by your health care provider. Make sure you discuss any questions you have with your health care provider. °  °Document Released: 08/09/2005 Document Revised: 04/30/2012 Document Reviewed: 01/12/2012 °Elsevier Interactive Patient Education ©2016 [employer] ° °Cellulitis °Cellulitis is an infection of the skin and the tissue beneath it. The infected area is usually red and tender. Cellulitis occurs most often in the arms and lower legs.  °CAUSES  °Cellulitis is caused by bacteria that enter the skin through cracks or cuts in the skin. The most common types of bacteria that cause cellulitis are staphylococci and streptococci. °SIGNS AND SYMPTOMS  °· Redness and warmth. °· Swelling. °· Tenderness or pain. °· Fever. °DIAGNOSIS  °Your health care provider can usually determine what is wrong based on a physical exam. Blood tests may also be done. °TREATMENT  °Treatment usually involves taking an antibiotic medicine. °HOME CARE INSTRUCTIONS  °·   Take your antibiotic medicine as directed by your health care provider. Finish the antibiotic even if you start to feel better.  Keep the infected arm or leg elevated to reduce swelling.  Apply a warm cloth to the affected area up to 4 times per day to relieve pain.  Take medicines only as directed by your health care  provider.  Keep all follow-up visits as directed by your health care provider. SEEK MEDICAL CARE IF:   You notice red streaks coming from the infected area.  Your red area gets larger or turns dark in color.  Your bone or joint underneath the infected area becomes painful after the skin has healed.  Your infection returns in the same area or another area.  You notice a swollen bump in the infected area.  You develop new symptoms.  You have a fever. SEEK IMMEDIATE MEDICAL CARE IF:   You feel very sleepy.  You develop vomiting or diarrhea.  You have a general ill feeling (malaise) with muscle aches and pains.   This information is not intended to replace advice given to you by your health care provider. Make sure you discuss any questions you have with your health care provider.   Document Released: 08/09/2005 Document Revised: 07/21/2015 Document Reviewed: 01/15/2012 Elsevier Interactive Patient Education 2016 [employer]  Pilonidal Cyst A pilonidal cyst is a fluid-filled sac. It forms beneath the skin near your tailbone, at the top of the crease of your buttocks. A pilonidal cyst that is not large or infected may not cause symptoms or problems. If the cyst becomes irritated or infected, it may fill with pus. This causes pain and swelling (pilonidal abscess). An infected cyst may need to be treated with medicine, drained, or removed. CAUSES The cause of a pilonidal cyst is not known. One cause may be a hair that grows into your skin (ingrown hair). RISK FACTORS Pilonidal cysts are more common in boys and men. Risk factors include:  Having lots of hair near the crease of the buttocks.  Being overweight.  Having a pilonidal dimple.  Wearing tight clothing.  Not bathing or showering frequently.  Sitting for long periods of time. SIGNS AND SYMPTOMS Signs and symptoms of a pilonidal cyst may include:  Redness.  Pain and  tenderness.  Warmth.  Swelling.  Pus.  Fever. DIAGNOSIS Your health care provider may diagnose a pilonidal cyst based on your symptoms and a physical exam. The health care provider may do a blood test to check for infection. If your cyst is draining pus, your health care provider may take a sample of the drainage to be tested at a laboratory. TREATMENT Surgery is the usual treatment for an infected pilonidal cyst. You may also have to take medicines before surgery. The type of surgery you have depends on the size and severity of the infected cyst. The different kinds of surgery include:  Incision and drainage. This is a procedure to open and drain the cyst.  Marsupialization. In this procedure, a large cyst or abscess may be opened and kept open by stitching the edges of the skin to the cyst walls.  Cyst removal. This procedure involves opening the skin and removing all or part of the cyst. HOME CARE INSTRUCTIONS  Follow all of your surgeon's instructions carefully if you had surgery.  Take medicines only as directed by your health care provider.  If you were prescribed an antibiotic medicine, finish it all even if you start to feel better.  Keep  the area around your pilonidal cyst clean and dry.  Clean the area as directed by your health care provider. Pat the area dry with a clean towel. Do not rub it as this may cause bleeding.  Remove hair from the area around the cyst as directed by your health care provider.  Do not wear tight clothing or sit in one place for long periods of time.  There are many different ways to close and cover an incision, including stitches, skin glue, and adhesive strips. Follow your health care provider's instructions on:  Incision care.  Bandage (dressing) changes and removal.  Incision closure removal. SEEK MEDICAL CARE IF:   You have drainage, redness, swelling, or pain at the site of the cyst.  You have a fever.   This information is  not intended to replace advice given to you by your health care provider. Make sure you discuss any questions you have with your health care provider.   Document Released: 10/27/2000 Document Revised: 11/20/2014 Document Reviewed: 03/19/2014 Elsevier Interactive Patient Education [employer]

## 2016-01-02 NOTE — ED Provider Notes (Signed)
[facility] Emergency Department Provider Note  ____________________________________________  Time seen: Approximately 251 AM  I have reviewed the triage vital signs and the nursing notes.   HISTORY  Chief Complaint Cyst  HPI Shelley Aguirre is a 25 y.o. female who comes in today with a cyst on her buttock. The patient reports that she went to her primary care physician and showed it to them. She reports that they told her that it did not look infected. She reports though that has gotten better and has been throbbing. The patient has had it before but this went away on its own. The patient has been doing sitz bath and hot washcloths to her bottom but the area has gotten bigger and more tender. The patient rates her pain a 5 out of 10 in intensity currently. She reports it is throbbing and she is unable to sit on it anymore. The patient decided to come in and get it checked out.   Past Medical History  Diagnosis Date  . Asthma   . GERD (gastroesophageal reflux disease)   . Hyperlipidemia   . Bipolar 2 disorder (HCC)   . Circumvallate placenta   . Hepatitis C     Patient Active Problem List   Diagnosis Date Noted  . Bilateral thoracic back pain 12/06/2015  . Hepatitis C   . Asthma   . Bipolar 2 disorder (HCC) 04/16/2015    Past Surgical History  Procedure Laterality Date  . Tonsillectomy  2007  . Appendectomy      2000  . Wisdom tooth extraction      Current Outpatient Rx  Name  Route  Sig  Dispense  Refill  . clindamycin (CLEOCIN) 300 MG capsule   Oral   Take 1 capsule (300 mg total) by mouth 3 (three) times daily.   30 capsule   0   . cyclobenzaprine (FLEXERIL) 10 MG tablet   Oral   Take 1 tablet (10 mg total) by mouth at bedtime.   30 tablet   0   . ferrous sulfate (FERROUSUL) 325 (65 FE) MG tablet   Oral   Take 1 tablet (325 mg total) by mouth daily with breakfast.   30 tablet   3   . omeprazole (PRILOSEC) 20 MG capsule   Oral    Take 1 capsule (20 mg total) by mouth daily.   30 capsule   6   . oxyCODONE-acetaminophen (ROXICET) 5-325 MG tablet   Oral   Take 1 tablet by mouth every 6 (six) hours as needed.   12 tablet   0     Allergies Flu virus vaccine and Penicillins  Family History  Problem Relation Age of Onset  . Cancer Maternal Grandmother     colon  . Diabetes Maternal Grandfather   . Heart disease Paternal Grandmother   . Crohn's disease Paternal Grandmother   . Autoimmune disease Father   . Crohn's disease Father     Social History Social History  Substance Use Topics  . Smoking status: Current Every Day Smoker    Types: Cigarettes    Last Attempt to Quit: 11/13/2014  . Smokeless tobacco: Never Used  . Alcohol Use: No    Review of Systems Constitutional: No fever/chills Eyes: No visual changes. ENT: No sore throat. Cardiovascular: Denies chest pain. Respiratory: Denies shortness of breath. Gastrointestinal: No abdominal pain.  No nausea, no vomiting.  No diarrhea.  No constipation. Genitourinary: Negative for dysuria. Musculoskeletal: Negative for back pain. Skin:  Cyst to buttock Neurological: Negative for headaches, focal weakness or numbness.  10-point ROS otherwise negative.  ____________________________________________   PHYSICAL EXAM:  VITAL SIGNS: ED Triage Vitals  Enc Vitals Group     BP 01/01/16 2205 133/86 mmHg     Pulse Rate 01/01/16 2205 106     Resp 01/01/16 2205 18     Temp 01/01/16 2205 98.2 F (36.8 C)     Temp Source 01/01/16 2205 Oral     SpO2 01/01/16 2205 100 %     Weight 01/01/16 2205 176 lb (79.833 kg)     Height 01/01/16 2205  (1.651 m)     Head Cir --      Peak Flow --      Pain Score 01/01/16 2206 5     Pain Loc --      Pain Edu? --      Excl. in GC? --     Constitutional: Alert and oriented. Well appearing and in no acute distress. Eyes: Conjunctivae are normal. PERRL. EOMI. Head: Atraumatic. Nose: No  congestion/rhinnorhea. Mouth/Throat: Mucous membranes are moist.  Oropharynx non-erythematous. Cardiovascular: Normal rate, regular rhythm. Grossly normal heart sounds.  Good peripheral circulation. Respiratory: Normal respiratory effort.  No retractions. Lungs CTAB. Gastrointestinal: Soft and nontender. No distention. Positive bowel sounds Musculoskeletal: No lower extremity tenderness nor edema.   Neurologic:  Normal speech and language.  Skin:  Skin is warm, dry and intact. Small area of fluctuance and erythema to the midline superior left gluteal cleft.  Psychiatric: Mood and affect are normal.   ____________________________________________   LABS (all labs ordered are listed, but only abnormal results are displayed)  Labs Reviewed - No data to display ____________________________________________  EKG  None ____________________________________________  RADIOLOGY  None ____________________________________________   PROCEDURES  Procedure(s) performed: please, see procedure note(s).   INCISION AND DRAINAGE Performed by: Lucrezia Europe P Consent: Verbal consent obtained. Risks and benefits: risks, benefits and alternatives were discussed Type: abscess  Body area: right gluteal cleft  Anesthesia: local infiltration  Incision was made with a scalpel.  Local anesthetic: EMLA  Drainage: purulent  Drainage amount: small   Patient tolerance: Patient tolerated the procedure well with no immediate complications.     Critical Care performed: No  ____________________________________________   INITIAL IMPRESSION / ASSESSMENT AND PLAN / ED COURSE  Pertinent labs & imaging results that were available during my care of the patient were reviewed by me and considered in my medical decision making (see chart for details).  This is a 25 year old female who comes into the hospital today with a cyst to her right buttock. I will drain the area and give the patient's  clindamycin as she does have some mild cellulitis. I will discharge the patient when she's received the medication.  The patient had a small pilonidal abscess to her gluteal cleft that was more on the left. I did place a small incision and drain the fluid. The patient will be discharged home to follow-up with her primary care physician. ____________________________________________   FINAL CLINICAL IMPRESSION(S) / ED DIAGNOSES  Final diagnoses:  Pilonidal abscess  Cellulitis of buttock      Rebecka Apley, MD 01/02/16 0507

## 2016-01-03 ENCOUNTER — Ambulatory Visit: Payer: Commercial Managed Care - HMO | Admitting: Family Medicine

## 2016-01-05 ENCOUNTER — Encounter: Payer: Self-pay | Admitting: Family Medicine

## 2016-05-10 IMAGING — US US ABDOMEN LIMITED
1 series · 14 of 25 positions shown · non-contrast
Comparison: None.

CLINICAL DATA: Acute right upper quadrant abdominal pain.

EXAM:
US ABDOMEN LIMITED - RIGHT UPPER QUADRANT

[Series 1: us abdomen limited · 0.25mm/px · 14 of 37 slices shown]
[im 1/37]
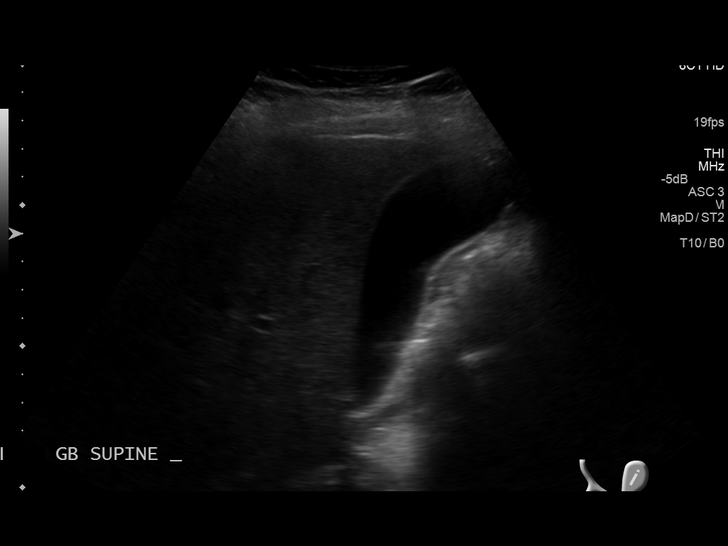
[im 4/37]
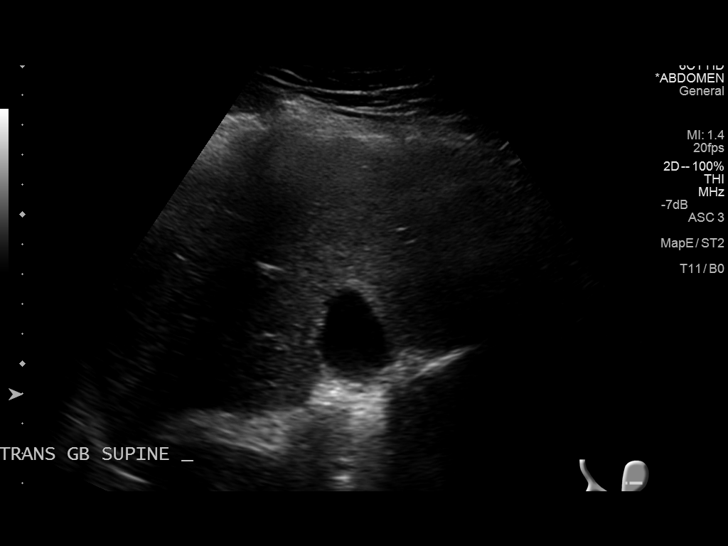
[im 7/37]
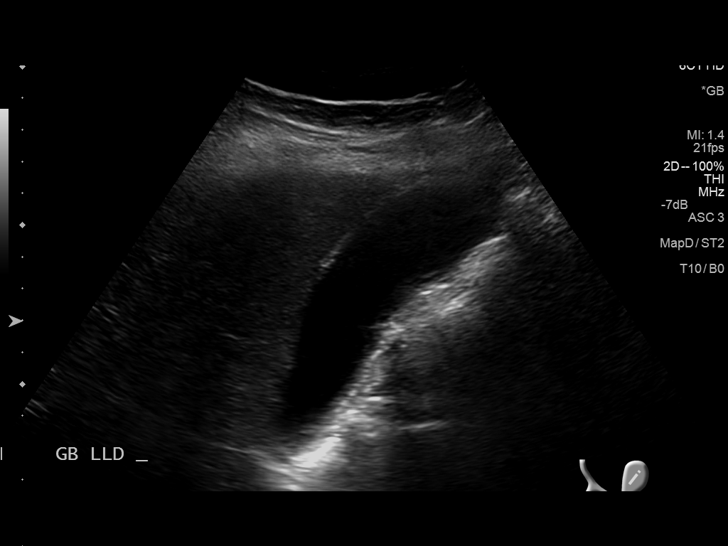
[im 10/37]
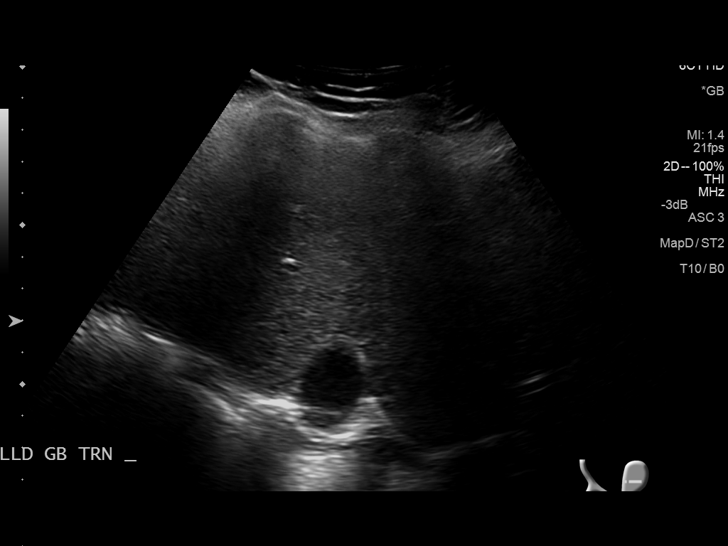
[im 13/37]
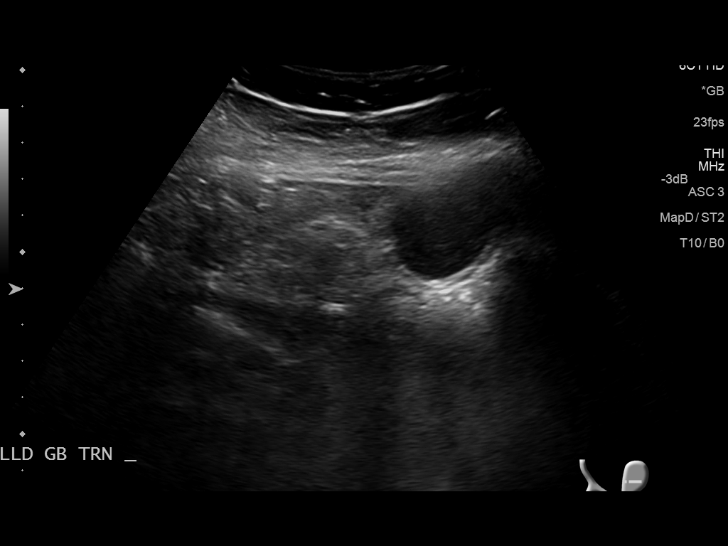
[im 14/37]
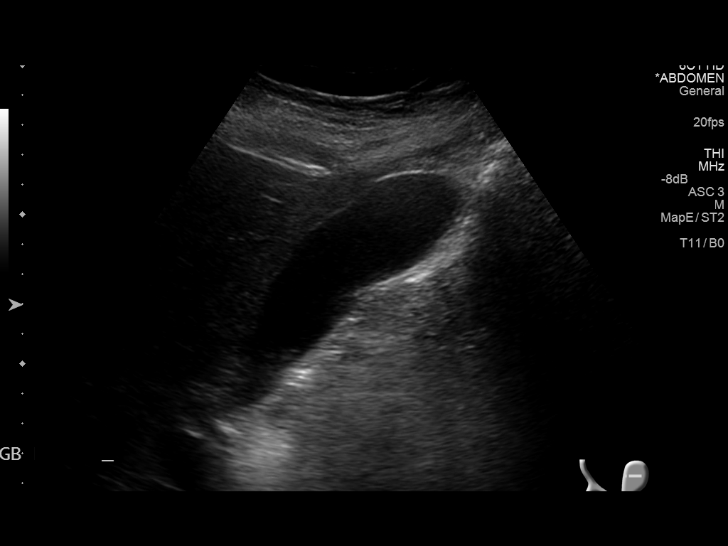
[im 17/37]
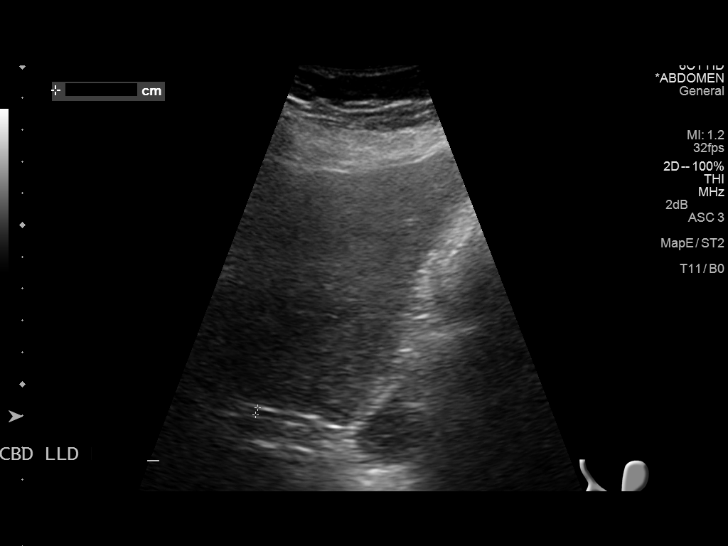
[im 20/37]
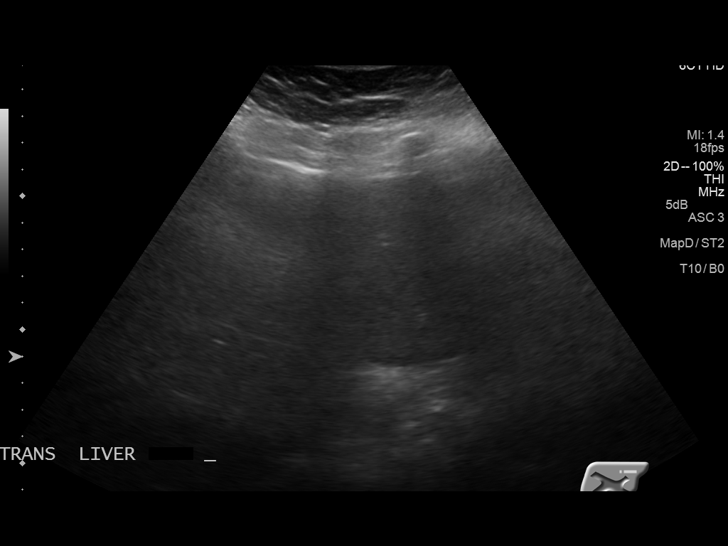
[im 23/37]
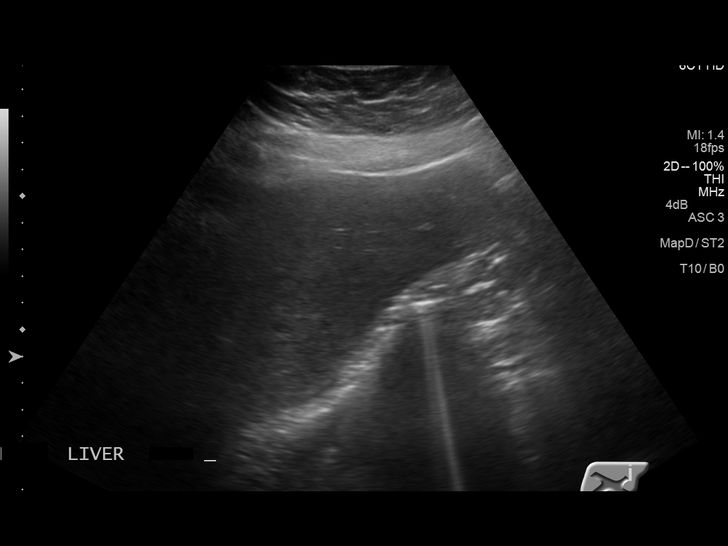
[im 25/37]
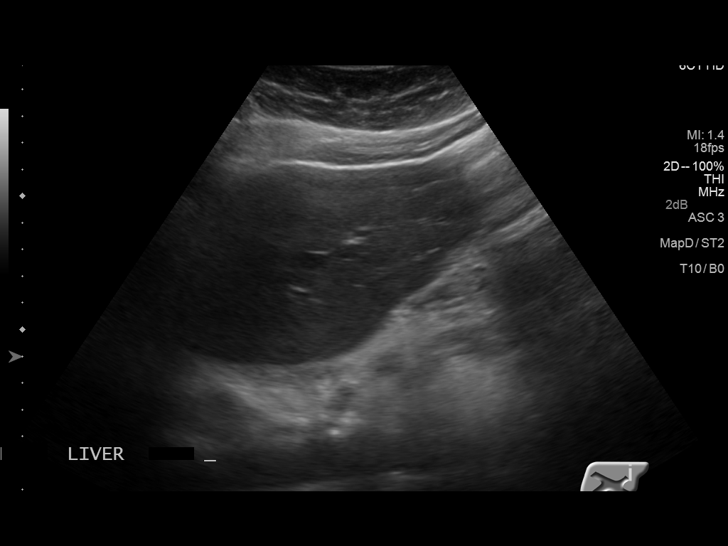
[im 28/37]
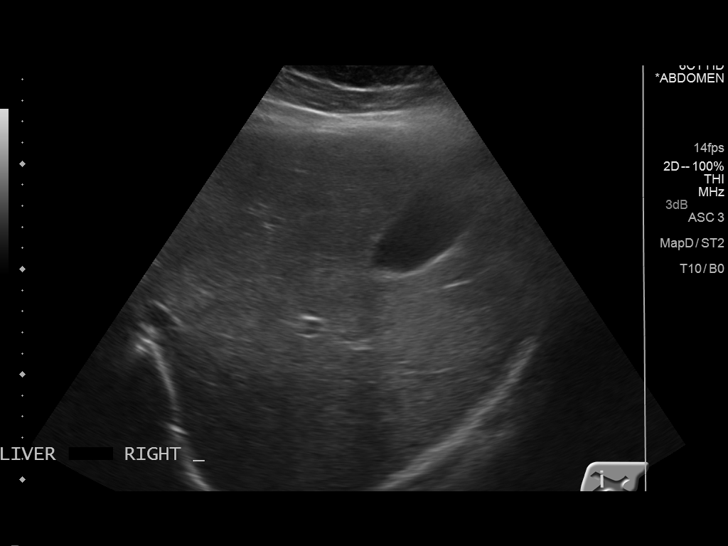
[im 31/37]
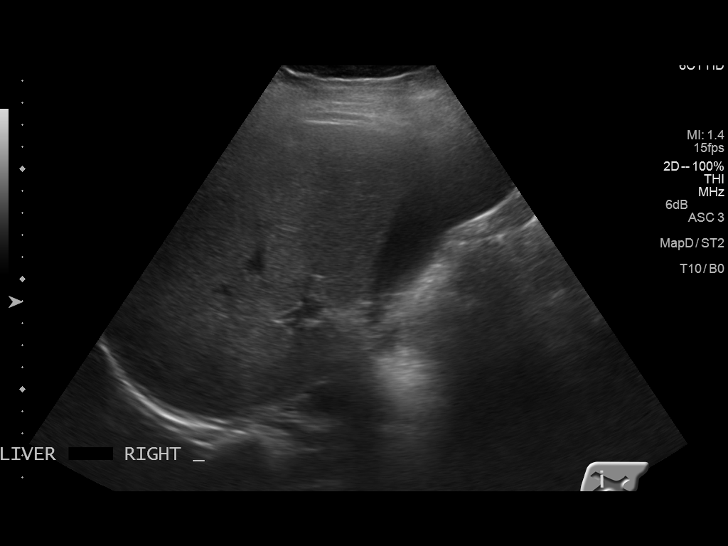
[im 34/37]
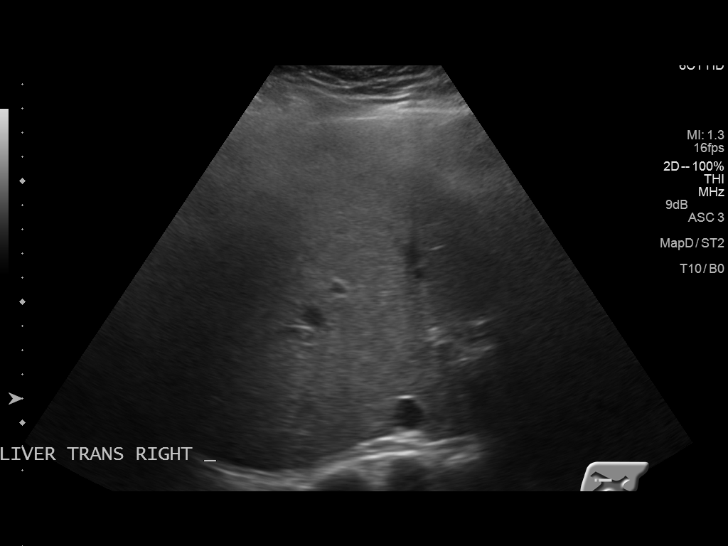
[im 37/37]
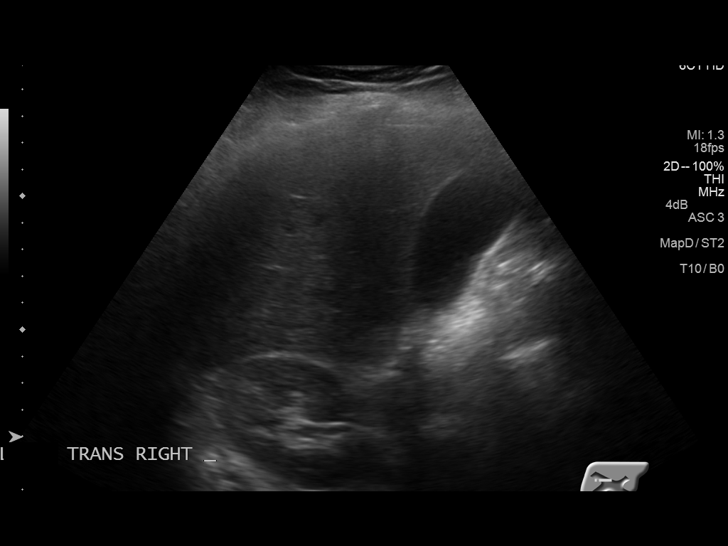

[14 of 25 positions shown; findings below may reference images not displayed]

FINDINGS: Gallbladder:

No gallstones or wall thickening visualized. No sonographic Murphy
sign noted.

Common bile duct:

Diameter: 2.3 mm which is within normal limits.

Liver:

No focal lesion identified. Within normal limits in parenchymal
echogenicity.
IMPRESSION: No abnormality seen in the right upper quadrant of the abdomen.

## 2016-06-27 ENCOUNTER — Encounter: Payer: Self-pay | Admitting: Family Medicine

## 2016-06-27 ENCOUNTER — Telehealth: Payer: Self-pay

## 2016-06-27 NOTE — Telephone Encounter (Signed)
Patient would like a note for the gym stating that she cant work out due to her asthma for an undisclosed amount of time, her asthma does flare up when she tried to exercise. This is the only way that she will be able to get out of her contract.

## 2016-06-27 NOTE — Telephone Encounter (Signed)
Letter written an don your desk

## 2016-06-27 NOTE — Telephone Encounter (Signed)
Patient called, tried to call patient back no answer, left a message for patient to return my call.

## 2016-09-17 IMAGING — US US ABDOMEN LIMITED
1 series · 14 of 25 positions shown · non-contrast
Comparison: Ultrasound abdomen 07/24/2015

CLINICAL DATA: Right upper quadrant pain

EXAM:
US ABDOMEN LIMITED - RIGHT UPPER QUADRANT

[Series 1: us abdomen limited · 0.22mm/px · 14 of 49 slices shown]
[im 1/49]
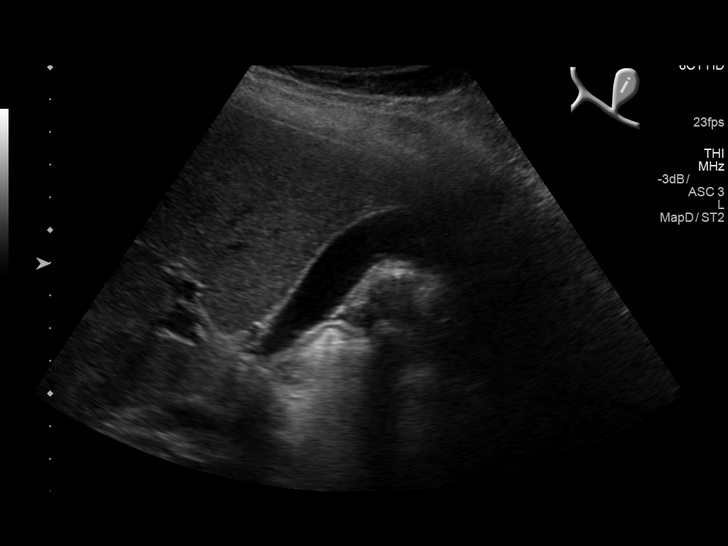
[im 5/49]
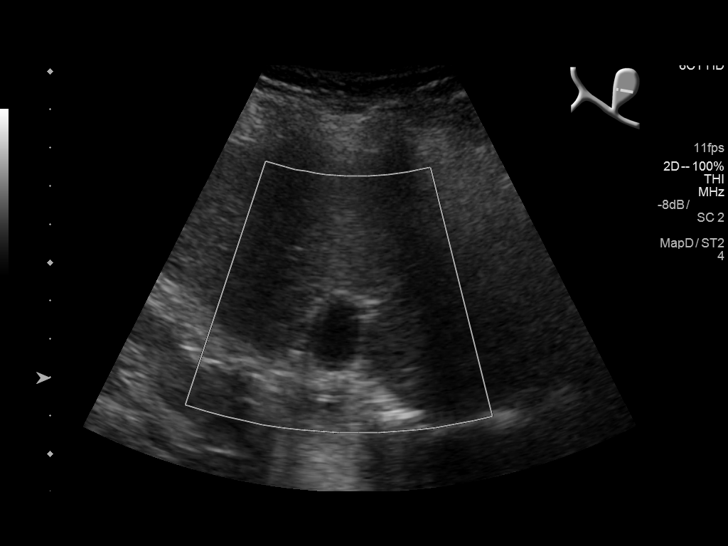
[im 9/49]
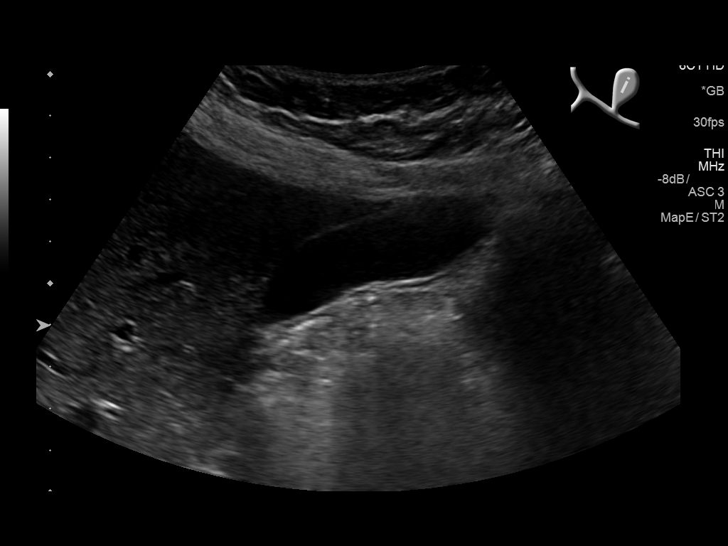
[im 13/49]
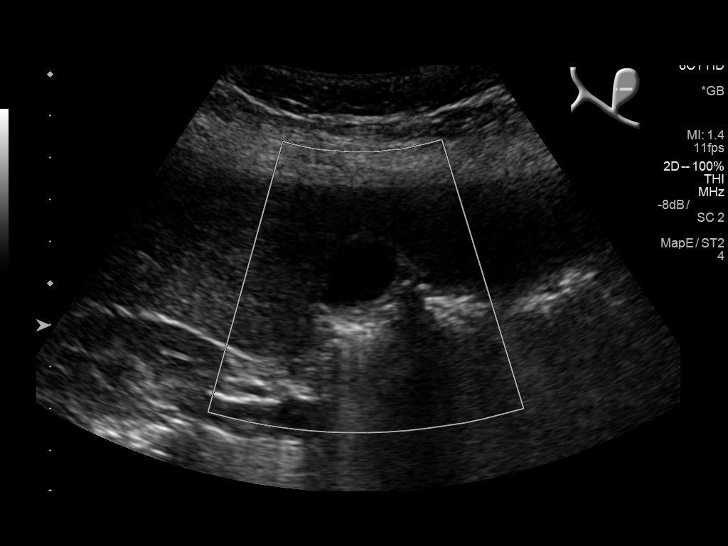
[im 17/49]
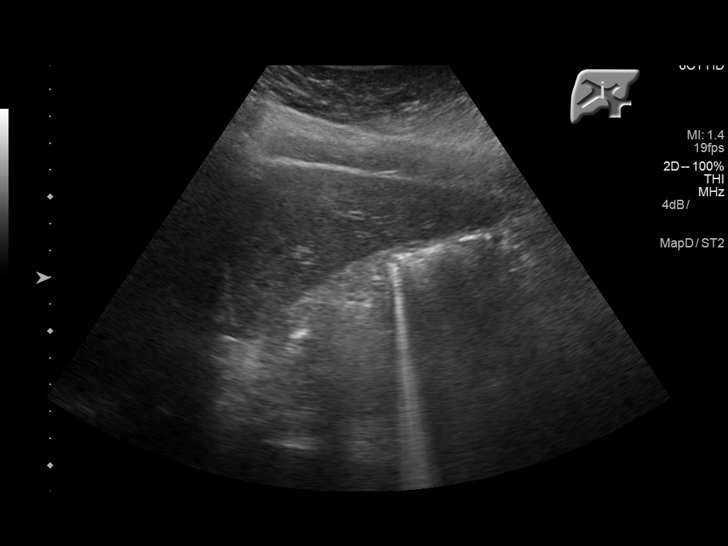
[im 19/49]
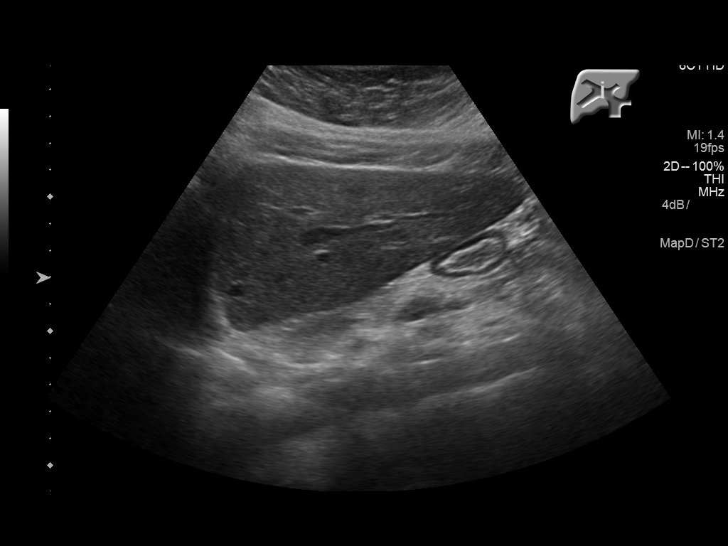
[im 23/49]
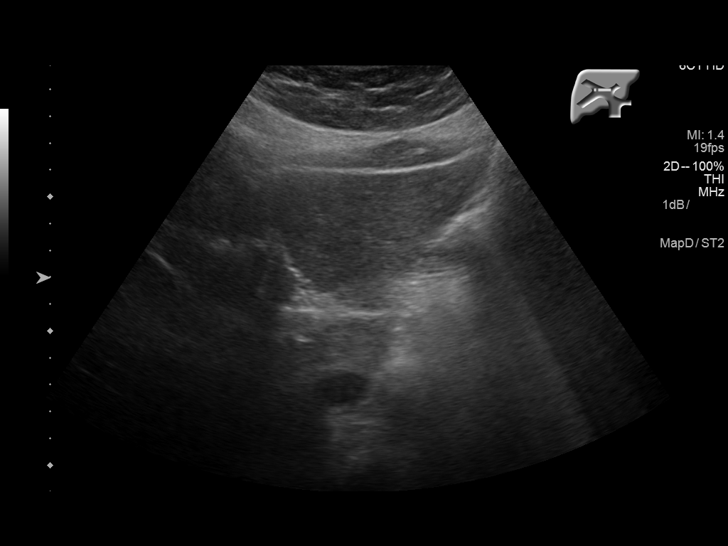
[im 27/49]
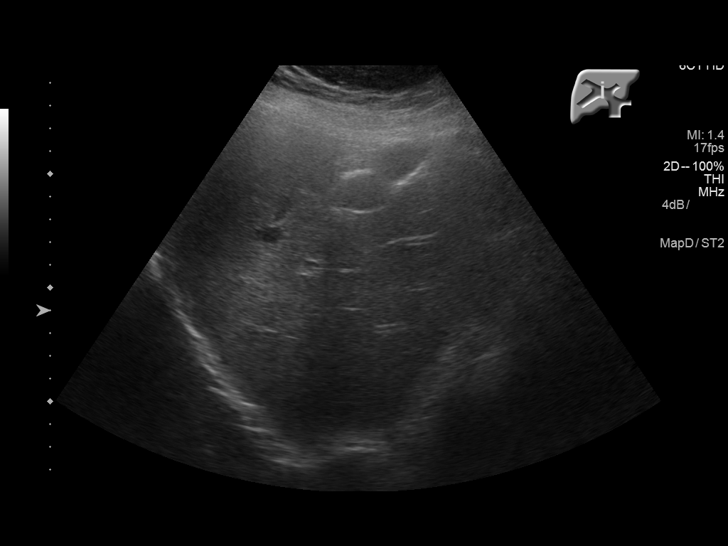
[im 31/49]
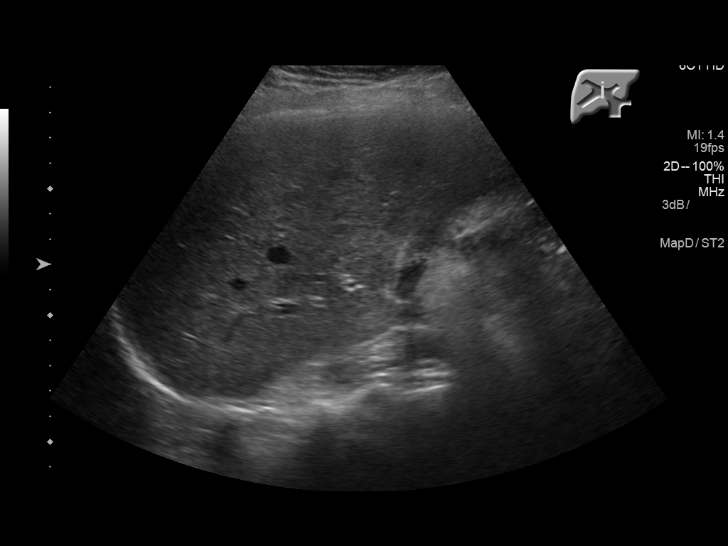
[im 33/49]
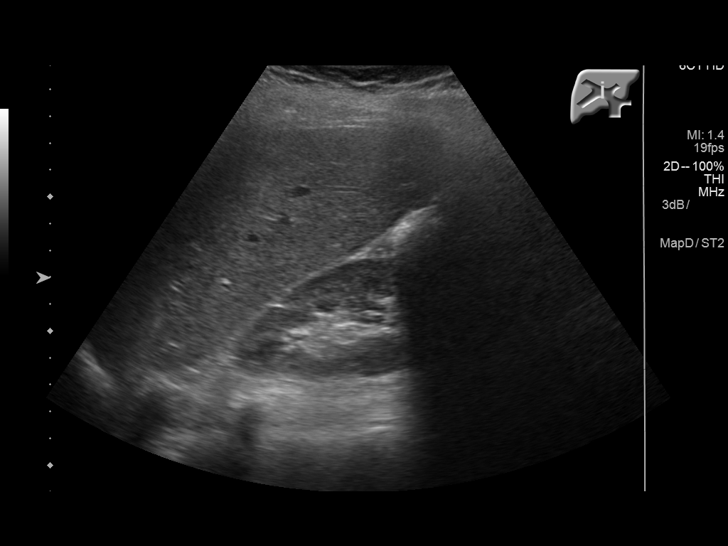
[im 37/49]
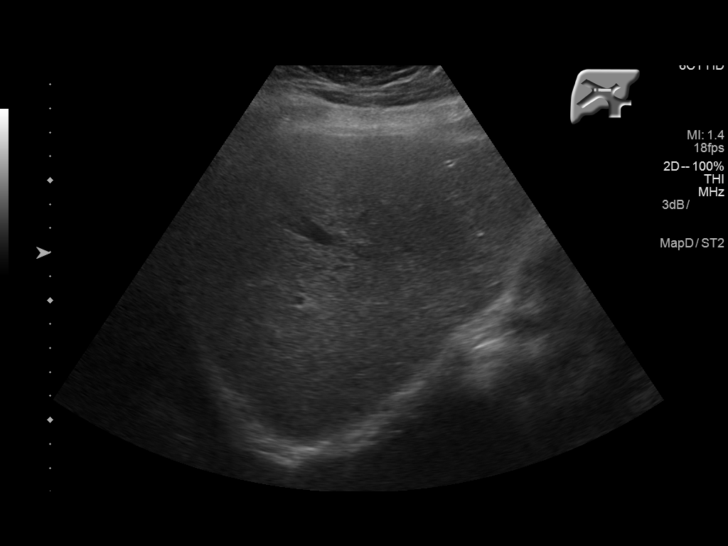
[im 41/49]
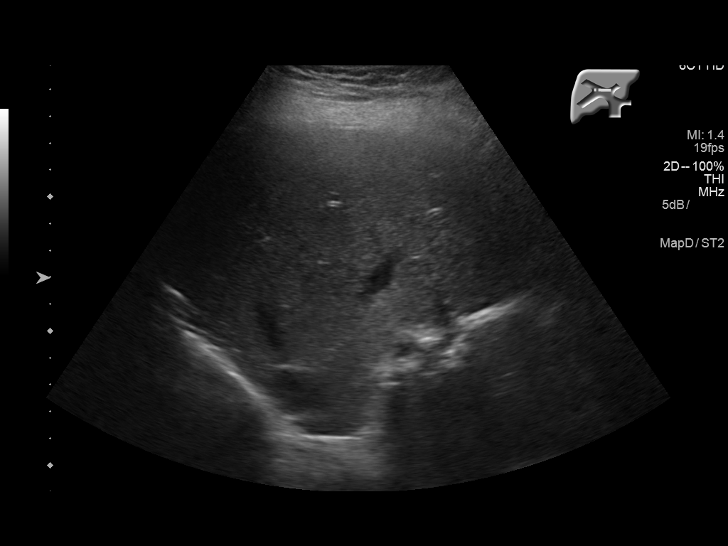
[im 45/49]
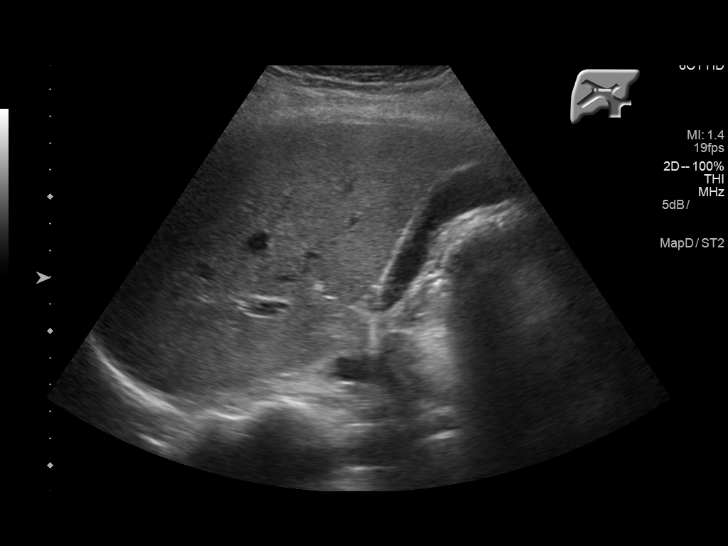
[im 49/49]
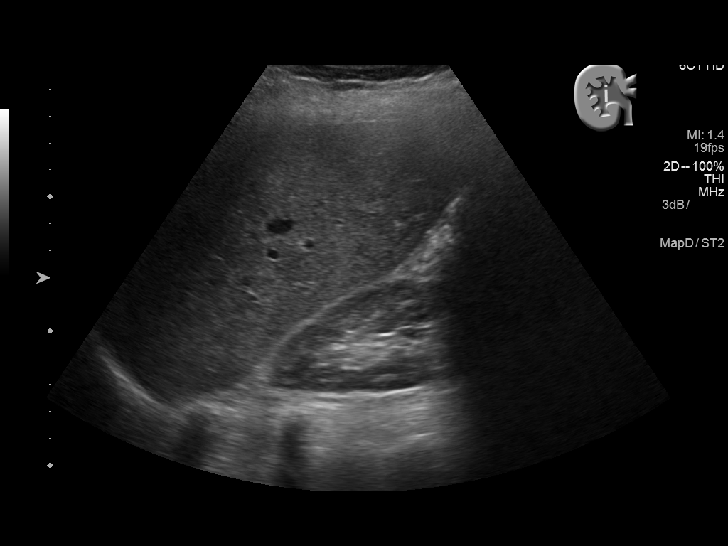

[14 of 25 positions shown; findings below may reference images not displayed]

FINDINGS: Gallbladder:

No gallstones or wall thickening visualized. No sonographic Murphy
sign noted by sonographer.

Common bile duct:

Diameter: 3.2 mm

Liver:

No focal lesion identified. Within normal limits in parenchymal
echogenicity.
IMPRESSION: Normal right upper quadrant ultrasound.
